# Patient Record
Sex: Male | Born: 1950 | Race: White | Hispanic: No | Marital: Married | Smoking: Never smoker
Health system: Southern US, Community
[De-identification: ages and names within clinical notes are randomized; demographics above are authoritative.]

## PROBLEM LIST (undated history)

## (undated) DIAGNOSIS — C801 Malignant (primary) neoplasm, unspecified: Secondary | ICD-10-CM

## (undated) DIAGNOSIS — M1990 Unspecified osteoarthritis, unspecified site: Secondary | ICD-10-CM

---

## 2000-06-15 ENCOUNTER — Encounter: Payer: Self-pay | Admitting: Orthopedic Surgery

## 2000-06-15 ENCOUNTER — Ambulatory Visit (HOSPITAL_COMMUNITY): Admission: RE | Admit: 2000-06-15 | Discharge: 2000-06-15 | Payer: Self-pay | Admitting: Orthopedic Surgery

## 2000-10-27 ENCOUNTER — Encounter: Payer: Self-pay | Admitting: Neurosurgery

## 2000-10-27 ENCOUNTER — Encounter: Admission: RE | Admit: 2000-10-27 | Discharge: 2000-10-27 | Payer: Self-pay | Admitting: Neurosurgery

## 2000-11-10 ENCOUNTER — Encounter: Admission: RE | Admit: 2000-11-10 | Discharge: 2000-11-10 | Payer: Self-pay | Admitting: Neurosurgery

## 2000-11-10 ENCOUNTER — Encounter: Payer: Self-pay | Admitting: Neurosurgery

## 2005-08-10 ENCOUNTER — Emergency Department (HOSPITAL_COMMUNITY): Admission: EM | Admit: 2005-08-10 | Discharge: 2005-08-10 | Payer: Self-pay | Admitting: Emergency Medicine

## 2006-10-20 ENCOUNTER — Encounter: Admission: RE | Admit: 2006-10-20 | Discharge: 2006-10-20 | Payer: Self-pay | Admitting: Orthopedic Surgery

## 2006-12-21 ENCOUNTER — Inpatient Hospital Stay (HOSPITAL_COMMUNITY): Admission: RE | Admit: 2006-12-21 | Discharge: 2006-12-24 | Payer: Self-pay | Admitting: Orthopedic Surgery

## 2007-04-03 ENCOUNTER — Inpatient Hospital Stay (HOSPITAL_COMMUNITY): Admission: RE | Admit: 2007-04-03 | Discharge: 2007-04-06 | Payer: Self-pay | Admitting: Orthopedic Surgery

## 2007-09-27 ENCOUNTER — Ambulatory Visit: Payer: Self-pay | Admitting: Gastroenterology

## 2007-10-11 ENCOUNTER — Ambulatory Visit: Payer: Self-pay | Admitting: Gastroenterology

## 2010-03-26 HISTORY — PX: HIP SURGERY: SHX245

## 2010-09-24 HISTORY — PX: MELANOMA EXCISION: SHX5266

## 2010-12-08 NOTE — Op Note (Signed)
Samuel Lee, Samuel Lee            ACCOUNT NO.:  [id number]   MEDICAL RECORD NO.:  [id number]          PATIENT TYPE:  INP   LOCATION:  1613                         FACILITY:  [facility]   PHYSICIAN:  Ollen Gross, M.D.    DATE OF BIRTH:  [birth year]   DATE OF PROCEDURE:  04/03/2007  DATE OF DISCHARGE:                               OPERATIVE REPORT   PREOPERATIVE DIAGNOSIS:  Osteoarthritis left hip.   POSTOPERATIVE DIAGNOSIS:  Osteoarthritis left hip.   PROCEDURE:  Left total hip arthroplasty.   SURGEON:  Ollen Gross, M.D.   ASSISTANT:  Alexzandrew L. Perkins, P.A.-C.   ANESTHESIA:  General.   ESTIMATED BLOOD LOSS:  600.   DRAIN:  None.   COMPLICATIONS:  None.   CONDITION:  Stable to recovery.   BRIEF CLINICAL NOTE:  Samuel Lee is a 60 year old male with end-stage  arthritis of left hip, with progressively worsening pain and  dysfunction.  He has had a successful right total hip arthroplasty and  presents now for left total hip arthroplasty.   PROCEDURE IN DETAIL:  After successful initiation of general anesthetic,  the patient was placed in the right lateral decubitus position with the  left side up and held with the hip positioner.  The left lower extremity  was isolated from his perineum with plastic drapes and prepped and  draped in the usual sterile fashion.  A short posterolateral incision  was made with a 10 blade through subcutaneous tissue to the level of the  fascia lata which was incised in line with the skin incision.  Sciatic  nerve was palpated and protected and the short rotators were isolated  off the femur.  Capsulectomy was performed, and the hip was dislocated.  Center of the femoral head is marked and a trial prosthesis placed such  that the center of the trial head corresponds to the center of his  native femoral head.  Osteotomy line was marked on the femoral neck and  osteotomy made with an oscillating saw.  Femoral head was removed and  the femur  distracted anteriorly to gain acetabular exposure.   Acetabular retractors were placed and labrum and osteophytes removed.  Reaming starts at 45 mm, coursing in increments of 2 to 53 mm, and then  a 54 mm Pinnacle acetabular shell was placed in anatomic position and  transfixed with two dome screws.  The apex hole eliminator was placed.  Then, a 36 mm neutral Ultamet metal liner was placed.   Femur was prepared with the canal finder and irrigation.  Axial reaming  was performed to 11.5 mm, proximal reaming to a 40F, and the sleeve  machine to a large.  A 40F large trial sleeve was placed with a 16 x 11  stem and a 36 standard neck.  We matching the native anteversion with  the stem.  A 36 head is placed, and hip reduces easily.  It appears to  need more offset.  We trialed with a 36 +6, and it reduced with much  more suitable tension.  We were able to achieve full extension, full  external rotation, 70 degrees flexion,  40 degrees adduction, 90 degrees  internal rotation, 90 degrees flexion, and 70 degrees of internal  rotation.  By placing the left leg on top of the right, it felt as  though leg lengths were equal.  Hip was then dislocated, and trials were  removed.  The permanent 81F large sleeve was placed, with 16 x 11 stem,  36 +6 neck, matching native anteversion.  The 36 +0 head was placed.  The hips was reduced, with the same stability parameters.  Wound was  copiously irrigated with saline solution, and short rotators were  reattached to the femur through drill holes.  Fascia lata was closed  with interrupted #1 Vicryl, subcutaneous closed with #1, then and 2-0  Vicryl, and subcuticular running 4-0 Monocryl.  Incision was cleaned and  dried, and Steri-Strips and a bulky sterile dressing applied.  He was  then placed into a knee immobilizer, awakened, and transported to  recovery in stable condition.      Ollen Gross, M.D.  Electronically Signed     FA/MEDQ  D:   04/03/2007  T:  04/04/2007  Job:  [id number]

## 2010-12-08 NOTE — H&P (Signed)
Samuel Lee, Samuel Lee            ACCOUNT NO.:  [id number]   MEDICAL RECORD NO.:  [id number]          PATIENT TYPE:  INP   LOCATION:  NA                           FACILITY:  [facility]   PHYSICIAN:  Ollen Gross, M.D.    DATE OF BIRTH:  1951/05/16   DATE OF ADMISSION:  04/03/2007  DATE OF DISCHARGE:                              HISTORY & PHYSICAL   DATE OF OFFICE VISIT AND HISTORY OF PHYSICAL:  March 28, 2007.   CHIEF COMPLAINT:  Left hip pain.   HISTORY OF PRESENT ILLNESS:  The patient is a 60 year old male who has  been seen by Dr. Lequita Halt for ongoing left hip pain.  He was known to  have bilateral hip arthritis, but he has undergone a previous right  total hip and doing well with this.  He has recovered well and now  presents for the left hip.   ALLERGIES:  NO KNOWN DRUG ALLERGIES.   CURRENT MEDICATIONS:  OXYCONTIN.   PAST MEDICAL HISTORY:  1. Renal calculi.  2. Lumbar degenerative disc disease.   PAST SURGICAL HISTORY:  1. Eye surgery.  2. Right total hip.   SOCIAL HISTORY:  Married.  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  Nonsmoker.  No  alcohol.  Two children.  Wife will be assisting with care after surgery.   FAMILY HISTORY:  Father living, age 52, with heart disease and CABG.  Mother deceased at age 66.  Brother with an arrhythmia, deceased at age  44.   REVIEW OF SYSTEMS:  GENERAL:  No fevers, chills, or night sweats.  NEUROLOGIC:  No seizures, syncope, or paralysis.  RESPIRATORY:  No  shortness of breath, productive cough, or hemoptysis.  CARDIOVASCULAR:  No chest pain, angina, or orthopnea.  GI:  No nausea, vomiting,  diarrhea, or constipation.  GU:  No dysuria, hematuria, or discharge.  MUSCULOSKELETAL:  Left hip.   PHYSICAL EXAMINATION:  VITAL SIGNS:  Pulse 64, respirations 12, blood  pressure 112/64.  GENERAL:  A 60 year old white male, well nourished, well developed, in  no acute distress.  He is alert, oriented, and cooperative, pleasant.  Good historian,  accompanied by his wife.  HEENT:  Normocephalic, atraumatic.  Pupils round, reactive.  Oropharynx  clear.  EOMs intact.  NECK:  Supple.  CHEST:  Clear anterior and posterior chest walls.  No rhonchi, rales, or  wheezing.  HEART:  Regular rate and rhythm.  No murmur.  S1, S2 noted.  ABDOMEN:  Soft, nontender.  Bowel sounds present.  RECTAL/BREASTS/GENITALIA:  Not done.  Not pertinent to present illness.  EXTREMITIES:  Left hip:  Motor function is intact.  Painful range of  motion.  Slight antalgic gait.   IMPRESSION:  Osteoarthritis left hip.   PLAN:  The patient will be admitted to [facility] to undergo a  left total hip replacement arthroplasty.  Surgery will be performed by  Dr. Ollen Gross.      Alexzandrew L. Perkins, P.A.C.      Ollen Gross, M.D.  Electronically Signed    ALP/MEDQ  D:  04/02/2007  T:  04/03/2007  Job:  [id number]  cc:   Joycelyn Rua, M.D.  Fax: [phone]

## 2010-12-08 NOTE — Discharge Summary (Signed)
NAME:  Samuel Lee, Samuel Lee            ACCOUNT NO.:  [id number]   MEDICAL RECORD NO.:  [id number]          PATIENT TYPE:  INP   LOCATION:  1613                         FACILITY:  [facility]   PHYSICIAN:  Alexzandrew L. Perkins, P.A.C.DATE OF BIRTH:  1950/10/23   DATE OF ADMISSION:  04/03/2007  DATE OF DISCHARGE:  04/06/2007                               DISCHARGE SUMMARY   ADMISSION DIAGNOSES:  1. Osteoarthritis left hip.  2. Lumbar degenerative disk disease.  3. History of renal calculi.   DISCHARGE DIAGNOSES:  Osteoarthritis left hip status post total hip  replacement arthroplasty.  __________ .   PROCEDURE:  Left total hip surgery with Dr. Despina Hick, assistant Julien Girt,  PA-C, anesthesia general.   CONSULTS:  None.   BRIEF HISTORY:  The patient is a 60 year old male with end-stage  arthritis of the left hip. __________   LABORATORY DATA:  __________  hemoglobin 13, hematocrit __________ ,  last H&H is 10.9 and 30.8.  __________  INR 1.1.  __________ .  X-rays left hip March 28, 2007 osteoarthritic changes left hip.   __________ .  Had a pretty rough night with pain __________  a little  bit better.  The next morning he was placed back on his OxyContin, which  he uses at home.  Re-encouraged p.o., pain medications PCA for backup.  Started getting him out of bed.  Re urinary output.  Hemoglobin stable.  By day 2, the pain was under a little bit better control so we  discontinued the PCA and, from a therapy standpoint, started improving.  Started getting up and walking about 175 feet, later 300 feet that  afternoon.  Dressing was changed on day 2 and the incision looks good.  No signs of infection by day 3.  He was doing well.  Tolerating  regimens, ready for discharge home.   DISCHARGE PLAN:  The patient is discharged home on 04/06/07.   DISCHARGE DIAGNOSIS:  Please see above.   DISCHARGE MEDICATIONS:  1. OxyContin.  2. Percocet.  3. Robaxin.  4. Coumadin.   DIET:  Resume  home diet.   ACTIVITY:  Partial weightbearing 25-50% left lower extremity.  __________  total hip protocol.   FOLLOWUP:  In 2 weeks.   DISPOSITION:  __________  condition on discharge improved.      Alexzandrew L. Perkins, P.A.C.     ALP/MEDQ  D:  04/06/2007  T:  04/06/2007  Job:  [id number]   cc:   Ollen Gross, M.D.  Fax: [phone]   Joycelyn Rua, M.D.  Fax: [phone]

## 2010-12-08 NOTE — Op Note (Signed)
NAMESASAN, WILKIE            ACCOUNT NO.:  [id number]   MEDICAL RECORD NO.:  [id number]          PATIENT TYPE:  INP   LOCATION:  X003                         FACILITY:  [facility]   PHYSICIAN:  Ollen Gross, M.D.    DATE OF BIRTH:  10/15/1950   DATE OF PROCEDURE:  12/21/2006  DATE OF DISCHARGE:                               OPERATIVE REPORT   PREOPERATIVE DIAGNOSIS:  Osteoarthritis, right hip.   POSTOPERATIVE DIAGNOSIS:  Osteoarthritis, right hip.   PROCEDURE:  Right total hip arthroplasty.   SURGEON:  Ollen Gross, M.D.   ASSISTANT:  Avel Peace PA-C   ANESTHESIA:  General.   ESTIMATED BLOOD LOSS:  400.   DRAINS:  None.   COMPLICATIONS:  None.   CONDITION:  Stable to recovery.   CLINICAL NOTE:  Samuel Lee a 60 year old male who has end-stage arthritis  of both hips, right more symptomatic than the left.  He has had  significant pain and dysfunction and presents for total hip  arthroplasty.   PROCEDURE IN DETAIL:  After successful administration of general  anesthetic, the patient's placed in left lateral decubitus position, the  right side up and held with the hip positioner. The right lower  extremity was isolated from his perineum with plastic drapes and prepped  and draped in usual sterile fashion.  Short posterolateral incision was  made with a 10 blade through the subcutaneous tissue to the level of the  fascia lata which was incised in line with the skin incision.  Sciatic  nerve was palpated and protected and the short rotators were isolated  off the femur.  Capsulectomy is performed and the hip was dislocated.  The center of femoral head is marked and a trial prosthesis placed such  that the center of the trial head corresponds to center of his native  femoral head.  Osteotomy lines marked on the femoral neck and osteotomy  made with an oscillating saw.  Femoral head is removed and the femur  retracted anteriorly to get acetabular exposure.   Acetabular  retractors were placed and labrum and osteophytes removed.  Reaming starts at 45 mm coursing increments of 2-51 mm and a 52 mm  pinnacle acetabular shell was placed in anatomic position and transfixed  with two dome screws.  The apex hole eliminator is placed and then the  36 mm neutral Ultramet metal liner was placed for a metal-on-metal hip  replacement.   The femur is prepared with the canal finder and irrigation.  Axial  reaming is performed 13.5 mm, proximal reaming to 24F and the sleeve  machine to a large.  A 24F large trial sleeve is placed with 18 x 15  stem and a 36+8 neck matching native anteversion.  The 36+0 trial was  placed.  The hip had too much offset and was not easily reduced.  I went  to a 36 standard neck matching native anteversion with a 36+0 head.  This led to excellent stable reduction and with appropriate offset.  There was full extension, full external rotation, 70 degrees flexion, 40  degrees adduction, 90 degrees internal rotation and 90 degrees of  flexion, 70 degrees of internal rotation.  By placing the right leg on  top of the left it felt as though the leg lengths were equal.  The hip  was then dislocated and trials removed.  The permanent 30F large sleeve  is placed with the 18 x 13 stem, 36 standard neck matching native  anteversion, 36+0 head is placed and the hips reduced with the same  stability parameters.  Wounds copiously irrigated with saline solution  and short rotators reattached to the femur through drill holes.  The  fascia lata was closed over a Hemovac drain with interrupted #1 Vicryl.  Subcu closed with #1 and then 2-0 Vicryl and subcuticular running 4-0  Monocryl.  Incisions cleaned and dried and Steri-Strips and bulky  sterile dressing applied.  Placed into a knee immobilizer, awakened,  transported to recovery in stable condition.      Ollen Gross, M.D.  Electronically Signed     FA/MEDQ  D:  12/21/2006  T:  12/21/2006  Job:   [id number]

## 2010-12-11 NOTE — H&P (Signed)
NAMESHALAMAR, CRAYS            ACCOUNT NO.:  [id number]   MEDICAL RECORD NO.:  [id number]          PATIENT TYPE:  INP   LOCATION:  NA                           FACILITY:  [facility]   PHYSICIAN:  Ollen Gross, M.D.    DATE OF BIRTH:  [birth year]   DATE OF ADMISSION:  12/21/2006  DATE OF DISCHARGE:                              HISTORY & PHYSICAL   CHIEF COMPLAINT:  Right hip pain.   HISTORY OF PRESENT ILLNESS:  The patient is a 60 year old male who has  been seen in second opinion by Dr. Lequita Halt for ongoing hip pain.  It has  been ongoing for quite some time now, for many years, it has been  progressive in nature.  He has been seen by two orthopedic surgeons in  the past and told that he had arthritic hips.  It has progressively  gotten worse.  He was seen in the office by Dr. Lequita Halt and found to  have severe arthritis in both hips, the right is worse on radiograph as  compared to the left.  He continues to have pain and dysfunction despite  conservative measures.  It was felt he would benefit from undergoing  surgical intervention.  The risks and benefits have been discussed and  he elects to proceed with surgery.   ALLERGIES:  NO KNOWN DRUG ALLERGIES.   CURRENT MEDICATIONS:  OxyContin.   PAST MEDICAL HISTORY:  Renal calculi.   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  Nonsmoker.  No alcohol.  Two children.  Wife will be assisting him with  care after surgery.   FAMILY HISTORY:  Mother with history of arthritis.  Brother deceased  secondary to an arrhythmia at age 17.   REVIEW OF SYSTEMS:  GENERAL:  General view shows no fevers, chills or  night sweats, seizures, syncope or paralysis.  RESPIRATORY:  No  shortness of productive, no cough or hemoptysis.  CARDIOVASCULAR:  No  chest pain or orthopnea.  GI:  No nausea, vomiting, diarrhea or  constipation.  GU: No dysuria, hematuria or  discharge.  MUSCULOSKELETAL:  Right hip.   PHYSICAL EXAMINATION:  VITAL SIGNS:  Pulse 72, respirations 12, blood  pressure 112/78.  GENERAL:  A 56-year white male well-nourished, well-developed, small  framed, alert and cooperative, accompanied by his wife.  HEENT:  Normocephalic, atraumatic.  Pupils are round and reactive.  EOMs  intact.  He does have decreased vision slightly in the left eye.  He  does wear glasses when he is working and reading.  NECK:  Supple.  CHEST:  Clear.  HEART: Regular rate and rhythm.  No murmur, S1 and S2.  ABDOMEN:  Soft and nontender.  Bowel sounds are present.  RECTAL:  Not done.  EXTREMITIES:  Right hip shows flexion to 90 degrees, 15 degrees of  abduction, 0 of internal rotation, 0 external rotation.   IMPRESSION:  Osteoarthritis, right hip.   PLAN:  The patient is admitted to [facility] to undergo a  right total replacement  arthroplasty.  Surgery will be performed by Dr.  Ollen Gross.      Alexzandrew L. Perkins, P.A.C.      Ollen Gross, M.D.  Electronically Signed    ALP/MEDQ  D:  12/20/2006  T:  12/21/2006  Job:  [id number]   cc:   Ollen Gross, M.D.  Fax: [phone]   Joycelyn Rua, M.D.  Fax: [phone]

## 2010-12-11 NOTE — Discharge Summary (Signed)
Samuel Lee, Samuel Lee            ACCOUNT NO.:  [id number]   MEDICAL RECORD NO.:  [id number]          PATIENT TYPE:  INP   LOCATION:  1613                         FACILITY:  [facility]   PHYSICIAN:  Ollen Gross, M.D.    DATE OF BIRTH:  1951/03/23   DATE OF ADMISSION:  04/03/2007  DATE OF DISCHARGE:  04/06/2007                               DISCHARGE SUMMARY   Re-dictating a discharge summary, apparently there was a lot left out.   ADMISSION DIAGNOSES:  1. Osteoarthritis, left hip.  2. Lumbar degenerative disease.  3. History of renal calculi.   DISCHARGE DIAGNOSES:  1. Osteoarthritis, left hip, status post left total hip arthroplasty.  2. Lumbar degenerative disease.  3. History of renal calculi.   PROCEDURE:  On April 04, 2007, left total hip.   SURGEON:  Ollen Gross, M.D.   ASSISTANT:  Alexzandrew L. Perkins, P.A.C.   ANESTHESIA:  General.   CONSULTATIONS:  None.   BRIEF HISTORY:  The patient is a 60 year old male with end-stage  arthritis of the left hip with progressive worsening pain, dysfunction,  successful right total hip and now presents for left total hip.   LABORATORY DATA:  Preop CBC showed a hemoglobin of 15.0, hematocrit  43.2, white cell count 6.2.  Postop hemoglobin 12.2 drifting down to  10.7, came back up last night, H&H 10.9 and 30.8.  PT/PTT preop 14.2 and  28 respectively.  INR 1.1.  Serial pro-times followed.  Last noted  PT/INR 24.7 and 2.2.  Chem panel on admission all within normal limits.  Serial BMETs were followed.  Electrolytes remained within normal limits.  Preop UA negative.  Blood group type O+.   DIAGNOSTICS:  1. Hip films preop on April 04, 2007:  Osteoarthritic changes left      hip.  2. Portable pelvis and hip:  Satisfactory position left total hip.  3. EKG Dec 02, 2006:  Sinus rhythm rate 71, normal EKG confirmed by Dr.      Mayford Knife COURSE:  The patient was admitted to [facility] and  tolerated the  procedure well.  Later transferred to the recovery room  and then to the orthopedic floor, started on PCA and p.o. analgesics for  pain control following surgery.  He had a pretty tough night following  surgery.  He was doing a little bit better on the morning of day 1.  He  was on OxyContin continued dose medications postoperatively.  Percocet  for pain.  Weaned over to p.o. medications.  He had excellent urinary  output.  Started getting out of bed on day 1.  By day 2, pain was still  present, but doing a little bit better under better control.  He started  getting up and ambulating fairly well, actually did about 175 feet and  then 300 feet on day 2.  Dressing was changed.  Incision looked good.  Pain was under better control, so we discontinued the PCA and just  weaned over to p.o. meds.  He continued to progress well.  By day 3, he  was doing well and ready to  go home.   DISPOSITION:  The patient discharged home on April 06, 2007.   ASSESSMENT:  1. Osteoarthritis, left hip, status post left total hip arthroplasty.  2. Lumbar degenerative disease.  3. History of renal calculi.   DISCHARGE MEDICATIONS:  1. OxyContin.  2. Percocet.  3. Robaxin.  4. Coumadin.   DIET:  Resume home diet.   ACTIVITIES:  Partial weightbearing 25-50% to the left lower extremity.  He should be on total hip protocol hip precautions.   DISCHARGE INSTRUCTIONS:  1. Home health, PT home health nursing.  2. Follow up in 2 weeks.   CONDITION ON DISCHARGE:  Improved.      Alexzandrew L. Perkins, P.A.C.      Ollen Gross, M.D.  Electronically Signed    ALP/MEDQ  D:  05/11/2007  T:  05/12/2007  Job:  [id number]   cc:   Ollen Gross, M.D.  Fax: [phone]   Joycelyn Rua, M.D.  Fax: [phone]

## 2010-12-11 NOTE — Discharge Summary (Signed)
NAMEMARKON, JARES            ACCOUNT NO.:  [id number]   MEDICAL RECORD NO.:  [id number]          PATIENT TYPE:  INP   LOCATION:  1616                         [facility]ACILITY[facility]  [facility]   PHYSICIAN:  Ollen Gross, M.D.    DATE OF BIRTH:  1951-04-27   DATE OF ADMISSION:  12/21/2006  DATE OF DISCHARGE:  12/24/2006                               DISCHARGE SUMMARY   ADMITTING DIAGNOSES:  1. Osteoarthritis, right hip.  2. History of renal calculi.   DISCHARGE DIAGNOSES:  1. Osteoarthritis right hip status post right total hip arthroplasty.  2. Postop blood loss anemia.  Did not require transfusion.  3. History of renal calculi.   PROCEDURE:  On Dec 21, 2006, right total hip.  Surgeon Dr. Lequita Halt,  assistant Avel Peace PA-C.  Anesthesia general.   CONSULTATIONS:  None.   BRIEF HISTORY:  Samuel Lee is a 60 year old male with end-stage arthritis  of both hips, right more symptomatic than left.  He had significant pain  dysfunction who presents now for total hip.   LABORATORY DATA:  Preop CBC showed hemoglobin 15.0, hematocrit of 43.5,  white cell count 7.7.  Postop hemoglobin 0.8, drift down to 11, last H&H  10.3 and 29.4.  PT/PTT preop 14.7 and 28, respectively.  INR 1.1.  Serial pro times followed.  PT/INR 34.2 and 3.1.  Chem panel on  admission all within normal limits.  Serial B-mets were followed.  Sodium did drop down to 137, last noted at 134.  Preop UA negative.  Blood group type O+.  Chest x-ray Dec 15, 2006, no active disease.  Hip  films Dec 15, 2006, bilateral hip arthritis, right greater than left.  Possible free osteochondral body medial to the right femoral neck.  Postop films:  No apparent complication post hip arthroplasty.   HOSPITAL COURSE:  The patient was Hospital tolerated see well, later  transferred to recovery room on the orthopedic floor.  Started on PCA  and p.o. analgesic for pain control following surgery.  Given 24 hours  postop IV antibiotics.  Placed on  Coumadin for DVT prophylaxis.  Doing  pretty well, was able to get some rest the night after surgery.  Was a  little groggy the next morning.  Had excellent urinary output.  Hemoglobin stable at 11.8.  Started partial weightbearing.  Got up with  PT.  Doing a little bit better by the next day.  Hemoglobin was stable  at 11.  Dressing changed.  Incision looked good.  He was already up  ambulating in the hallway.  He is up walking about 200 feet, doing  extremely well.  Progressed well and was ready to go home by the  following day of Dec 24, 2006.   DISCHARGE/PLAN:  1. Discharged home Dec 24, 2006.  2. Discharge diagnoses, please see above.  3. Discharge medication:  OxyContin, Percocet, Robaxin, Coumadin.  4. Diet.  Resume home diet.  5. Activity:  Partial weightbearing right lower extremity 25-50%.  6. Hip precautions.  Total hip protocol with PT.  7. Home Health nursing.  8. Follow-up 2 weeks.   DISPOSITION:  Home.  CONDITION ON DISCHARGE:  Improved.      Alexzandrew L. Perkins, P.A.C.      Ollen Gross, M.D.  Electronically Signed    ALP/MEDQ  D:  01/20/2007  T:  01/20/2007  Job:  [id number]   cc:   Ollen Gross, M.D.  Fax: [phone]   Joycelyn Rua, M.D.  Fax: [phone]

## 2011-05-07 LAB — CBC
HCT: 30.8 — ABNORMAL LOW
Hemoglobin: 10.7 — ABNORMAL LOW
MCHC: 34.9
Platelets: 158
Platelets: 187
Platelets: 211
RBC: 3.61 — ABNORMAL LOW
RBC: 3.66 — ABNORMAL LOW
RBC: 4.12 — ABNORMAL LOW
RDW: 13.3
RDW: 13.5
WBC: 7.2
WBC: 8
WBC: 8

## 2011-05-07 LAB — CROSSMATCH
ABO/RH(D): O POS
Antibody Screen: NEGATIVE

## 2011-05-07 LAB — BASIC METABOLIC PANEL
BUN: 6
Calcium: 8 — ABNORMAL LOW
Chloride: 103
Creatinine, Ser: 0.73
GFR calc non Af Amer: >60
GFR calc non Af Amer: >60
Potassium: 4.2
Sodium: 135
Sodium: 138

## 2011-05-07 LAB — URINALYSIS, ROUTINE W REFLEX MICROSCOPIC
Bilirubin Urine: NEGATIVE
Ketones, ur: NEGATIVE
Nitrite: NEGATIVE
Protein, ur: NEGATIVE
Urobilinogen, UA: 1

## 2011-05-07 LAB — COMPREHENSIVE METABOLIC PANEL
AST: 19
Albumin: 3.9
Alkaline Phosphatase: 50
BUN: 18
Chloride: 106
Potassium: 4.2
Total Bilirubin: 0.9
Total Protein: 6.5

## 2011-05-07 LAB — PROTIME-INR
INR: 1.1
INR: 1.4
INR: 2.7 — ABNORMAL HIGH
Prothrombin Time: 14.2
Prothrombin Time: 17.2 — ABNORMAL HIGH

## 2013-08-31 ENCOUNTER — Encounter (INDEPENDENT_AMBULATORY_CARE_PROVIDER_SITE_OTHER): Payer: Self-pay

## 2013-08-31 ENCOUNTER — Ambulatory Visit (INDEPENDENT_AMBULATORY_CARE_PROVIDER_SITE_OTHER): Payer: PRIVATE HEALTH INSURANCE | Admitting: Surgery

## 2013-08-31 ENCOUNTER — Encounter (INDEPENDENT_AMBULATORY_CARE_PROVIDER_SITE_OTHER): Payer: Self-pay | Admitting: Surgery

## 2013-08-31 VITALS — BP 132/78 | HR 86 | Temp 98.0°F | Resp 18 | Ht 69.0 in | Wt 197.0 lb

## 2013-08-31 DIAGNOSIS — K4090 Unilateral inguinal hernia, without obstruction or gangrene, not specified as recurrent: Secondary | ICD-10-CM

## 2013-08-31 NOTE — Patient Instructions (Signed)

## 2013-08-31 NOTE — Progress Notes (Signed)
Patient ID: Samuel Lee, male   DOB: 1951/05/06, 63 y.o.   MRN: [id number]  Chief Complaint  Patient presents with  . New Evaluation    R/H    HPI Samuel Lee is a 63 y.o. male.  Patient presents from urgent care do to bulge right groin that is painful. Patient developed right groin pain 2 weeks ago after a weightlifting workout. He denies any swelling in his right groin. He does have intermittent right groin pain made worse with exertion and made better with rest. The pain can be severe at times sharp in nature without radiation location right groin. He is concerned he has a hernia. HPI  No past medical history on file.  Past Surgical History  Procedure Laterality Date  . Hip surgery  03/2010    bilaterally hip  . Melanoma excision  09/2010    nose    No family history on file.  Social History History  Substance Use Topics  . Smoking status: Never Smoker   . Smokeless tobacco: Not on file  . Alcohol Use: Not on file    No Known Allergies  Current Outpatient Prescriptions  Medication Sig Dispense Refill  . naproxen sodium (ANAPROX) 220 MG tablet Take 220 mg by mouth 2 (two) times daily with a meal.       No current facility-administered medications for this visit.    Review of Systems Review of Systems  Constitutional: Negative for fever, chills and unexpected weight change.  HENT: Negative for congestion, hearing loss, sore throat, trouble swallowing and voice change.   Eyes: Negative for visual disturbance.  Respiratory: Negative for cough and wheezing.   Cardiovascular: Negative for chest pain, palpitations and leg swelling.  Gastrointestinal: Negative for nausea, vomiting, abdominal pain, diarrhea, constipation, blood in stool, abdominal distention, anal bleeding and rectal pain.  Genitourinary: Negative for hematuria and difficulty urinating.  Musculoskeletal: Negative for arthralgias.  Skin: Negative for rash and wound.  Neurological: Negative for  seizures, syncope, weakness and headaches.  Hematological: Negative for adenopathy. Does not bruise/bleed easily.  Psychiatric/Behavioral: Negative for confusion.    Blood pressure 132/78, pulse 86, temperature 98 F (36.7 C), resp. rate 18, height 5\' 9"  (1.753 m), weight 197 lb (89.359 kg).  Physical Exam Physical Exam  Constitutional: He is oriented to person, place, and time. He appears well-developed and well-nourished.  HENT:  Head: Normocephalic and atraumatic.  Eyes: EOM are normal. Pupils are equal, round, and reactive to light.  Neck: Normal range of motion. Neck supple.  Cardiovascular: Normal rate and regular rhythm.   Pulmonary/Chest: Effort normal and breath sounds normal.  Abdominal: Soft. There is no tenderness. A hernia is present. Hernia confirmed positive in the right inguinal area.  Musculoskeletal: Normal range of motion.  Neurological: He is alert and oriented to person, place, and time.  Skin: Skin is warm and dry.  Psychiatric: He has a normal mood and affect. His behavior is normal. Judgment normal.    Data Reviewed   Assessment    Right inguinal hernia symptomatically    Plan    Recommend repair right angle hernia with mesh.The risk of hernia repair include bleeding,  Infection,   Recurrence of the hernia,  Mesh use, chronic pain,  Organ injury,  Bowel injury,  Bladder injury,   nerve injury with numbness around the incision,  Death,  and worsening of preexisting  medical problems.  The alternatives to surgery have been discussed as well..  Long term expectations of both operative and  non operative treatments have been discussed.   The patient agrees to proceed.       Samuel Lee A. 08/31/2013, 12:48 PM

## 2013-10-15 ENCOUNTER — Encounter (INDEPENDENT_AMBULATORY_CARE_PROVIDER_SITE_OTHER): Payer: PRIVATE HEALTH INSURANCE | Admitting: Surgery

## 2013-12-06 ENCOUNTER — Ambulatory Visit: Admit: 2013-12-06 | Payer: Self-pay | Admitting: Surgery

## 2013-12-06 SURGERY — REPAIR, HERNIA, INGUINAL, ADULT
Anesthesia: General | Laterality: Right

## 2014-11-25 ENCOUNTER — Ambulatory Visit: Payer: Self-pay | Admitting: Surgery

## 2015-01-28 ENCOUNTER — Encounter: Payer: Self-pay | Admitting: Gastroenterology

## 2015-04-04 ENCOUNTER — Encounter (HOSPITAL_BASED_OUTPATIENT_CLINIC_OR_DEPARTMENT_OTHER): Payer: Self-pay | Admitting: *Deleted

## 2015-04-09 ENCOUNTER — Ambulatory Visit (HOSPITAL_BASED_OUTPATIENT_CLINIC_OR_DEPARTMENT_OTHER)
Admission: RE | Admit: 2015-04-09 | Discharge: 2015-04-09 | Disposition: A | Discharge status: Home / Self Care | Payer: PRIVATE HEALTH INSURANCE | Source: Ambulatory Visit | Attending: Surgery | Admitting: Surgery

## 2015-04-09 ENCOUNTER — Encounter (HOSPITAL_BASED_OUTPATIENT_CLINIC_OR_DEPARTMENT_OTHER): Payer: Self-pay | Admitting: Anesthesiology

## 2015-04-09 ENCOUNTER — Ambulatory Visit (HOSPITAL_BASED_OUTPATIENT_CLINIC_OR_DEPARTMENT_OTHER): Payer: PRIVATE HEALTH INSURANCE | Admitting: Anesthesiology

## 2015-04-09 ENCOUNTER — Encounter (HOSPITAL_BASED_OUTPATIENT_CLINIC_OR_DEPARTMENT_OTHER)
Admission: RE | Disposition: A | Discharge status: Home / Self Care | Payer: Self-pay | Source: Ambulatory Visit | Attending: Surgery

## 2015-04-09 DIAGNOSIS — K4090 Unilateral inguinal hernia, without obstruction or gangrene, not specified as recurrent: Secondary | ICD-10-CM | POA: Diagnosis present

## 2015-04-09 DIAGNOSIS — M1990 Unspecified osteoarthritis, unspecified site: Secondary | ICD-10-CM | POA: Insufficient documentation

## 2015-04-09 HISTORY — PX: INGUINAL HERNIA REPAIR: SHX194

## 2015-04-09 HISTORY — DX: Malignant (primary) neoplasm, unspecified: C80.1

## 2015-04-09 HISTORY — PX: INSERTION OF MESH: SHX5868

## 2015-04-09 HISTORY — DX: Unspecified osteoarthritis, unspecified site: M19.90

## 2015-04-09 SURGERY — REPAIR, HERNIA, INGUINAL, ADULT
Anesthesia: Regional | Site: Abdomen | Laterality: Right

## 2015-04-09 MED ORDER — LIDOCAINE HCL (CARDIAC) 20 MG/ML IV SOLN
INTRAVENOUS | Status: DC | PRN
  Administered 2015-04-09: 50 mg via INTRAVENOUS

## 2015-04-09 MED ORDER — GLYCOPYRROLATE 0.2 MG/ML IJ SOLN: 0.2000 mg | Freq: Once | INTRAMUSCULAR | Status: DC | PRN

## 2015-04-09 MED ORDER — ONDANSETRON HCL 4 MG/2ML IJ SOLN
INTRAMUSCULAR | Status: DC | PRN
  Administered 2015-04-09: 4 mg via INTRAVENOUS

## 2015-04-09 MED ORDER — FENTANYL CITRATE (PF) 100 MCG/2ML IJ SOLN
INTRAMUSCULAR | Status: AC
  Filled 2015-04-09: qty 2

## 2015-04-09 MED ORDER — CEFAZOLIN SODIUM-DEXTROSE 2-3 GM-% IV SOLR
INTRAVENOUS | Status: AC
Start: 2015-04-09 — End: 2015-04-09
  Filled 2015-04-09: qty 50

## 2015-04-09 MED ORDER — OXYCODONE HCL 5 MG PO TABS
ORAL_TABLET | ORAL | Status: AC
  Filled 2015-04-09: qty 1

## 2015-04-09 MED ORDER — HYDROMORPHONE HCL 1 MG/ML IJ SOLN
0.2500 mg | INTRAMUSCULAR | Status: DC | PRN
  Administered 2015-04-09 (×2): 0.5 mg via INTRAVENOUS

## 2015-04-09 MED ORDER — OXYCODONE-ACETAMINOPHEN 5-325 MG PO TABS: 1.0000 | ORAL_TABLET | ORAL | Status: DC | PRN

## 2015-04-09 MED ORDER — OXYCODONE HCL 5 MG/5ML PO SOLN: 5.0000 mg | Freq: Once | ORAL | Status: AC | PRN

## 2015-04-09 MED ORDER — OXYCODONE HCL 5 MG PO TABS
5.0000 mg | ORAL_TABLET | Freq: Once | ORAL | Status: AC | PRN
  Administered 2015-04-09: 5 mg via ORAL

## 2015-04-09 MED ORDER — SCOPOLAMINE 1 MG/3DAYS TD PT72: 1.0000 | MEDICATED_PATCH | Freq: Once | TRANSDERMAL | Status: DC | PRN

## 2015-04-09 MED ORDER — LACTATED RINGERS IV SOLN
INTRAVENOUS | Status: DC
  Administered 2015-04-09 (×2): via INTRAVENOUS

## 2015-04-09 MED ORDER — BUPIVACAINE-EPINEPHRINE 0.25% -1:200000 IJ SOLN
INTRAMUSCULAR | Status: DC | PRN
  Administered 2015-04-09: 10 mL

## 2015-04-09 MED ORDER — MIDAZOLAM HCL 2 MG/2ML IJ SOLN
INTRAMUSCULAR | Status: AC
  Filled 2015-04-09: qty 2

## 2015-04-09 MED ORDER — DEXAMETHASONE SODIUM PHOSPHATE 4 MG/ML IJ SOLN
INTRAMUSCULAR | Status: DC | PRN
  Administered 2015-04-09: 10 mg via INTRAVENOUS

## 2015-04-09 MED ORDER — FENTANYL CITRATE (PF) 100 MCG/2ML IJ SOLN
50.0000 ug | INTRAMUSCULAR | Status: DC | PRN
  Administered 2015-04-09: 100 ug via INTRAVENOUS

## 2015-04-09 MED ORDER — LIDOCAINE HCL (CARDIAC) 20 MG/ML IV SOLN
INTRAVENOUS | Status: AC
  Filled 2015-04-09: qty 5

## 2015-04-09 MED ORDER — CHLORHEXIDINE GLUCONATE 4 % EX LIQD: 1.0000 "application " | Freq: Once | CUTANEOUS | Status: DC

## 2015-04-09 MED ORDER — MIDAZOLAM HCL 2 MG/2ML IJ SOLN
INTRAMUSCULAR | Status: DC | PRN
  Administered 2015-04-09: 2 mg via INTRAVENOUS

## 2015-04-09 MED ORDER — CEFAZOLIN SODIUM-DEXTROSE 2-3 GM-% IV SOLR
2.0000 g | INTRAVENOUS | Status: AC
  Administered 2015-04-09: 2 g via INTRAVENOUS

## 2015-04-09 MED ORDER — ONDANSETRON HCL 4 MG/2ML IJ SOLN: 4.0000 mg | Freq: Four times a day (QID) | INTRAMUSCULAR | Status: DC | PRN

## 2015-04-09 MED ORDER — PROPOFOL 10 MG/ML IV BOLUS
INTRAVENOUS | Status: AC
  Filled 2015-04-09: qty 20

## 2015-04-09 MED ORDER — FENTANYL CITRATE (PF) 100 MCG/2ML IJ SOLN
INTRAMUSCULAR | Status: AC
  Filled 2015-04-09: qty 4

## 2015-04-09 MED ORDER — MIDAZOLAM HCL 2 MG/2ML IJ SOLN
1.0000 mg | INTRAMUSCULAR | Status: DC | PRN
Start: 2015-04-09 — End: 2015-04-09
  Administered 2015-04-09: 2 mg via INTRAVENOUS

## 2015-04-09 MED ORDER — FENTANYL CITRATE (PF) 100 MCG/2ML IJ SOLN
INTRAMUSCULAR | Status: DC | PRN
  Administered 2015-04-09: 50 ug via INTRAVENOUS

## 2015-04-09 MED ORDER — PROPOFOL 10 MG/ML IV BOLUS
INTRAVENOUS | Status: DC | PRN
  Administered 2015-04-09: 200 mg via INTRAVENOUS

## 2015-04-09 MED ORDER — HYDROMORPHONE HCL 1 MG/ML IJ SOLN
INTRAMUSCULAR | Status: AC
  Filled 2015-04-09: qty 1

## 2015-04-09 MED ORDER — ONDANSETRON HCL 4 MG/2ML IJ SOLN
INTRAMUSCULAR | Status: AC
  Filled 2015-04-09: qty 2

## 2015-04-09 MED ORDER — BUPIVACAINE-EPINEPHRINE (PF) 0.5% -1:200000 IJ SOLN
INTRAMUSCULAR | Status: DC | PRN
  Administered 2015-04-09: 30 mL via PERINEURAL

## 2015-04-09 MED ORDER — MIDAZOLAM HCL 2 MG/2ML IJ SOLN
INTRAMUSCULAR | Status: AC
  Filled 2015-04-09: qty 4

## 2015-04-09 SURGICAL SUPPLY — 49 items
BLADE CLIPPER SURG (BLADE) ×4 IMPLANT
BLADE SURG 15 STRL LF DISP TIS (BLADE) ×2 IMPLANT
BLADE SURG 15 STRL SS (BLADE) ×2
CANISTER SUCT 1200ML W/VALVE (MISCELLANEOUS) IMPLANT
CHLORAPREP W/TINT 26ML (MISCELLANEOUS) ×4 IMPLANT
COVER BACK TABLE 60X90IN (DRAPES) ×4 IMPLANT
COVER MAYO STAND STRL (DRAPES) ×4 IMPLANT
DECANTER SPIKE VIAL GLASS SM (MISCELLANEOUS) IMPLANT
DRAIN PENROSE 1/2X12 LTX STRL (WOUND CARE) ×4 IMPLANT
DRAPE LAPAROTOMY TRNSV 102X78 (DRAPE) ×4 IMPLANT
DRAPE UTILITY XL STRL (DRAPES) ×4 IMPLANT
ELECT COATED BLADE 2.86 ST (ELECTRODE) ×4 IMPLANT
ELECT REM PT RETURN 9FT ADLT (ELECTROSURGICAL) ×4
ELECTRODE REM PT RTRN 9FT ADLT (ELECTROSURGICAL) ×2 IMPLANT
GAUZE SPONGE 4X4 16PLY XRAY LF (GAUZE/BANDAGES/DRESSINGS) IMPLANT
GLOVE BIO SURGEON STRL SZ 6.5 (GLOVE) ×6 IMPLANT
GLOVE BIO SURGEONS STRL SZ 6.5 (GLOVE) ×2
GLOVE BIOGEL PI IND STRL 7.0 (GLOVE) ×2 IMPLANT
GLOVE BIOGEL PI IND STRL 8 (GLOVE) ×2 IMPLANT
GLOVE BIOGEL PI INDICATOR 7.0 (GLOVE) ×2
GLOVE BIOGEL PI INDICATOR 8 (GLOVE) ×2
GLOVE ECLIPSE 8.0 STRL XLNG CF (GLOVE) ×4 IMPLANT
GOWN STRL REUS W/ TWL LRG LVL3 (GOWN DISPOSABLE) ×4 IMPLANT
GOWN STRL REUS W/TWL LRG LVL3 (GOWN DISPOSABLE) ×4
LIQUID BAND (GAUZE/BANDAGES/DRESSINGS) ×4 IMPLANT
MESH HERNIA SYS ULTRAPRO LRG (Mesh General) ×4 IMPLANT
NEEDLE HYPO 25X1 1.5 SAFETY (NEEDLE) ×4 IMPLANT
NS IRRIG 1000ML POUR BTL (IV SOLUTION) ×4 IMPLANT
PACK BASIN DAY SURGERY FS (CUSTOM PROCEDURE TRAY) ×4 IMPLANT
PENCIL BUTTON HOLSTER BLD 10FT (ELECTRODE) ×4 IMPLANT
SLEEVE SCD COMPRESS KNEE MED (MISCELLANEOUS) ×4 IMPLANT
SPONGE GAUZE 4X4 12PLY STER LF (GAUZE/BANDAGES/DRESSINGS) IMPLANT
SPONGE LAP 4X18 X RAY DECT (DISPOSABLE) ×4 IMPLANT
SUT MON AB 4-0 PC3 18 (SUTURE) ×4 IMPLANT
SUT NOVA 0 T19/GS 22DT (SUTURE) ×12 IMPLANT
SUT VIC AB 0 SH 27 (SUTURE) ×4 IMPLANT
SUT VIC AB 2-0 SH 27 (SUTURE) ×2
SUT VIC AB 2-0 SH 27XBRD (SUTURE) ×2 IMPLANT
SUT VIC AB 3-0 54X BRD REEL (SUTURE) IMPLANT
SUT VIC AB 3-0 BRD 54 (SUTURE)
SUT VICRYL 3-0 CR8 SH (SUTURE) ×4 IMPLANT
SUT VICRYL AB 2 0 TIE (SUTURE) IMPLANT
SUT VICRYL AB 2 0 TIES (SUTURE)
SYR CONTROL 10ML LL (SYRINGE) ×4 IMPLANT
TOWEL OR 17X24 6PK STRL BLUE (TOWEL DISPOSABLE) ×4 IMPLANT
TOWEL OR NON WOVEN STRL DISP B (DISPOSABLE) ×4 IMPLANT
TUBE CONNECTING 20'X1/4 (TUBING)
TUBE CONNECTING 20X1/4 (TUBING) IMPLANT
YANKAUER SUCT BULB TIP NO VENT (SUCTIONS) IMPLANT

## 2015-04-09 NOTE — Anesthesia Procedure Notes (Addendum)
Procedure Name: LMA Insertion Date/Time: 04/09/2015 8:35 AM Performed by: Toula Moos L Pre-anesthesia Checklist: Patient identified, Emergency Drugs available, Suction available, Patient being monitored and Timeout performed Patient Re-evaluated:Patient Re-evaluated prior to inductionOxygen Delivery Method: Circle System Utilized Preoxygenation: Pre-oxygenation with 100% oxygen Intubation Type: IV induction Ventilation: Mask ventilation without difficulty LMA: LMA inserted LMA Size: 5.0 Number of attempts: 1 Airway Equipment and Method: Bite block Placement Confirmation: positive ETCO2 Tube secured with: Tape Dental Injury: Teeth and Oropharynx as per pre-operative assessment    Anesthesia Regional Block:  TAP block  Pre-Anesthetic Checklist: ,, timeout performed, Correct Patient, Correct Site, Correct Laterality, Correct Procedure, Correct Position, site marked, Risks and benefits discussed,  Surgical consent,  Pre-op evaluation,  At surgeon's request and post-op pain management  Laterality: Right  Prep: chloraprep       Needles:  Injection technique: Single-shot  Needle Type: Echogenic Needle     Needle Length: 9cm 9 cm Needle Gauge: 21 and 21 G    Additional Needles:  Procedures: ultrasound guided (picture in chart) TAP block Narrative:  Start time: 04/09/2015 8:11 AM End time: 04/09/2015 8:19 AM Injection made incrementally with aspirations every 5 mL. Anesthesiologist: Loredana Medellin  Additional Notes: Pt tolerated the procedure well.

## 2015-04-09 NOTE — Op Note (Signed)
Right Inguinal Hernia, Open, Procedure Note mesh   Indications: The patient presented with a history of a right, reducible inguinal  hernia.    Pre-operative Diagnosis: right inguinal Hernia  reducible  Post-operative Diagnosis: same  Surgeon: Erroll Luna A.   Assistants: none   Anesthesia: General LMA anesthesia, Local anesthesia 0.25.% bupivacaine, with epinephrine and TEP block   ASA Class: 2  Procedure Details  The patient was seen again in the Holding Room. The risks, benefits, complications, treatment options, and expected outcomes were discussed with the patient. The possibilities of reaction to medication, pulmonary aspiration, perforation of viscus, bleeding, recurrent infection, the need for additional procedures, and development of a complication requiring transfusion or further operation were discussed with the patient and/or family. There was concurrence with the proposed plan, and informed consent was obtained. The site of surgery was properly noted/marked. The patient was taken to the Operating Room, identified as Samuel Lee, and the procedure verified as hernia repair. A Time Out was held and the above information confirmed.  The patient was placed in the supine position and underwent induction of anesthesia, the lower abdomen and groin was prepped and draped in the standard fashion, and 0.25% Marcaine with epinephrine was used to anesthetize the skin over the mid-portion of the inguinal canal. A transverse incision was made. Dissection was carried through the soft tissue to expose the inguinal canal and inguinal ligament along its lower edge. The external oblique fascia was split along the course of its fibers, exposing the inguinal canal. The cord and nerve were looped using a Penrose drain and reflected out of the field. The defect was exposed and a piece of prolene hernia system ultrapro mesh was and placed into  the  Direct defect. Interupted 0 novafil suture was then  used  to repair the defect, with the suture being sewn from the pubic tubercle inferiorly and superiorly along the canal to a level just beyond the internal ring. The mesh was split to allow passage of the cord  into the canal without entrapment. Ilioinguinal nerve was tethered and this was divided to prevent entrapment.   The contents were then returned to canal and the external oblique fashion was then closed in a continuous fashion using 3-0 Vicryl suture taking care not to cause entrapment. Scarpa's layer closed with 3 0 vicryl and 4 0 monocryl used to close the skin.  Dermabond used for dressing.  Instrument, sponge, and needle counts were correct prior to closure and at the conclusion of the case.  Findings: Hernia as above  Estimated Blood Loss: Minimal         Drains: None         Total IV Fluids: 600 mL         Specimens: none               Complications: None; patient tolerated the procedure well.         Disposition: PACU - hemodynamically stable.         Condition: stable

## 2015-04-09 NOTE — Anesthesia Postprocedure Evaluation (Signed)
Anesthesia Post Note  Patient: Samuel Lee  Procedure(s) Performed: Procedure(s) (LRB): RIGHT INGUINAL HERNIA REPAIR WITH MESH (Right) INSERTION OF MESH (Right)  Anesthesia type: General  Patient location: PACU  Post pain: Pain level controlled and Adequate analgesia  Post assessment: Post-op Vital signs reviewed, Patient's Cardiovascular Status Stable, Respiratory Function Stable, Patent Airway and Pain level controlled  Last Vitals:  Filed Vitals:   04/09/15 1030  BP: 132/88  Pulse: 79  Temp:   Resp: 12    Post vital signs: Reviewed and stable  Level of consciousness: awake, alert  and oriented  Complications: No apparent anesthesia complications

## 2015-04-09 NOTE — Anesthesia Preprocedure Evaluation (Signed)
Anesthesia Evaluation  Patient identified by MRN, date of birth, ID band Patient awake    Reviewed: Allergy & Precautions, NPO status , Patient's Chart, lab work & pertinent test results  Airway Mallampati: II   Neck ROM: full    Dental   Pulmonary neg pulmonary ROS,    breath sounds clear to auscultation       Cardiovascular negative cardio ROS   Rhythm:regular Rate:Normal     Neuro/Psych    GI/Hepatic   Endo/Other    Renal/GU      Musculoskeletal  (+) Arthritis ,   Abdominal   Peds  Hematology   Anesthesia Other Findings   Reproductive/Obstetrics                             Anesthesia Physical Anesthesia Plan  ASA: II  Anesthesia Plan: General and Regional   Post-op Pain Management:    Induction: Intravenous  Airway Management Planned: LMA  Additional Equipment:   Intra-op Plan:   Post-operative Plan:   Informed Consent: I have reviewed the patients History and Physical, chart, labs and discussed the procedure including the risks, benefits and alternatives for the proposed anesthesia with the patient or authorized representative who has indicated his/her understanding and acceptance.     Plan Discussed with: CRNA, Anesthesiologist and Surgeon  Anesthesia Plan Comments:         Anesthesia Quick Evaluation

## 2015-04-09 NOTE — Interval H&P Note (Signed)
History and Physical Interval Note:  04/09/2015 8:04 AM  Samuel Lee  has presented today for surgery, with the diagnosis of Right Inguinal Hernia REpair  The various methods of treatment have been discussed with the patient and family. After consideration of risks, benefits and other options for treatment, the patient has consented to  Procedure(s): RIGHT INGUINAL HERNIA REPAIR WITH MESH (Right) INSERTION OF MESH (Right) as a surgical intervention .  The patient's history has been reviewed, patient examined, no change in status, stable for surgery.  I have reviewed the patient's chart and labs.  Questions were answered to the patient's satisfaction.     [name]

## 2015-04-09 NOTE — Discharge Instructions (Signed)
[facility] _______[facility] ° °UMBILICAL OR INGUINAL HERNIA REPAIR: POST OP INSTRUCTIONS ° °Always review your discharge instruction sheet given to you by the facility where your surgery was performed. °IF YOU HAVE DISABILITY OR FAMILY LEAVE FORMS, YOU MUST BRING THEM TO THE OFFICE FOR PROCESSING.   °DO NOT GIVE THEM TO YOUR DOCTOR. ° °1. A  prescription for pain medication may be given to you upon discharge.  Take your pain medication as prescribed, if needed.  If narcotic pain medicine is not needed, then you may take acetaminophen (Tylenol) or ibuprofen (Advil) as needed. °2. Take your usually prescribed medications unless otherwise directed. °3. If you need a refill on your pain medication, please contact your pharmacy.  They will contact our office to request authorization. Prescriptions will not be filled after 5 pm or on week-ends. °4. You should follow a light diet the first 24 hours after arrival home, such as soup and crackers, etc.  Be sure to include lots of fluids daily.  Resume your normal diet the day after surgery. °5. Most patients will experience some swelling and bruising around the umbilicus or in the groin and scrotum.  Ice packs and reclining will help.  Swelling and bruising can take several days to resolve.  °6. It is common to experience some constipation if taking pain medication after surgery.  Increasing fluid intake and taking a stool softener (such as Colace) will usually help or prevent this problem from occurring.  A mild laxative (Milk of Magnesia or Miralax) should be taken according to package directions if there are no bowel movements after 48 hours. °7. Unless discharge instructions indicate otherwise, you may remove your bandages 24-48 hours after surgery, and you may shower at that time.  You may have steri-strips (small skin tapes) in place directly over the incision.  These strips should be left on the skin for 7-10 days.  If your surgeon used skin glue on the  incision, you may shower in 24 hours.  The glue will flake off over the next 2-3 weeks.  Any sutures or staples will be removed at the office during your follow-up visit. °8. ACTIVITIES:  You may resume regular (light) daily activities beginning the next day--such as daily self-care, walking, climbing stairs--gradually increasing activities as tolerated.  You may have sexual intercourse when it is comfortable.  Refrain from any heavy lifting or straining until approved by your doctor. °a. You may drive when you are no longer taking prescription pain medication, you can comfortably wear a seatbelt, and you can safely maneuver your car and apply brakes. °b. RETURN TO WORK:  __________________________________________________________ °9. You should see your doctor in the office for a follow-up appointment approximately 2-3 weeks after your surgery.  Make sure that you call for this appointment within a day or two after you arrive home to insure a convenient appointment time. °10. OTHER INSTRUCTIONS:  __________________________________________________________________________________________________________________________________________________________________________________________  °WHEN TO CALL YOUR DOCTOR: °1. Fever over 101.0 °2. Inability to urinate °3. Nausea and/or vomiting °4. Extreme swelling or bruising °5. Continued bleeding from incision. °6. Increased pain, redness, or drainage from the incision ° °The clinic staff is available to answer your questions during regular business hours.  Please don’t hesitate to call and ask to speak to one of the nurses for clinical concerns.  If you have a medical emergency, go to the nearest emergency room or call 911.  A surgeon from [facility] is always on call at the hospital ° ° °  [address] [place], [place], [place]  [address] ?  [address], [place], [place]   [phone] ? [phone] ? FAX [phone] Web site:  [email or web address] Post Anesthesia Home Care Instructions  Activity: Get plenty of rest for the remainder of the day. A responsible adult should stay with you for 24 hours following the procedure.  For the next 24 hours, DO NOT: -Drive a car -Paediatric nurse -Drink alcoholic beverages -Take any medication unless instructed by your physician -Make any legal decisions or sign important papers.  Meals: Start with liquid foods such as gelatin or soup. Progress to regular foods as tolerated. Avoid greasy, spicy, heavy foods. If nausea and/or vomiting occur, drink only clear liquids until the nausea and/or vomiting subsides. Call your physician if vomiting continues.  Special Instructions/Symptoms: Your throat may feel dry or sore from the anesthesia or the breathing tube placed in your throat during surgery. If this causes discomfort, gargle with warm salt water. The discomfort should disappear within 24 hours.  If you had a scopolamine patch placed behind your ear for the management of post- operative nausea and/or vomiting:  1. The medication in the patch is effective for 72 hours, after which it should be removed.  Wrap patch in a tissue and discard in the trash. Wash hands thoroughly with soap and water. 2. You may remove the patch earlier than 72 hours if you experience unpleasant side effects which may include dry mouth, dizziness or visual disturbances. 3. Avoid touching the patch. Wash your hands with soap and water after contact with the patch.   Call your surgeon if you experience:   1.  Fever over 101.0. 2.  Inability to urinate. 3.  Nausea and/or vomiting. 4.  Extreme swelling or bruising at the surgical site. 5.  Continued bleeding from the incision. 6.  Increased pain, redness or drainage from the incision. 7.  Problems related to your pain medication. 8. Any change in color, movement and/or sensation 9. Any problems and/or concerns

## 2015-04-09 NOTE — Transfer of Care (Signed)
Immediate Anesthesia Transfer of Care Note  Patient: Samuel Lee  Procedure(s) Performed: Procedure(s): RIGHT INGUINAL HERNIA REPAIR WITH MESH (Right) INSERTION OF MESH (Right)  Patient Location: PACU  Anesthesia Type:GA combined with regional for post-op pain  Level of Consciousness: sedated  Airway & Oxygen Therapy: Patient Spontanous Breathing and Patient connected to face mask oxygen  Post-op Assessment: Report given to RN and Post -op Vital signs reviewed and stable  Post vital signs: Reviewed and stable  Last Vitals:  Filed Vitals:   04/09/15 0820  BP: 140/72  Pulse: 79  Temp:   Resp: 13    Complications: No apparent anesthesia complications

## 2015-04-09 NOTE — H&P (Signed)
Samuel Lee 11/25/2014 3:01 PM Location: [facility] Patient #: [id number] DOB: 1951/06/04 Married / Language: English / Race: White Male  History of Present Illness Samuel Lee A. Virna Livengood MD; 11/25/2014 3:27 PM) Patient words: Samuel Lee    Samuel Lee is a 64 y.o. male. Patient presents from urgent care do to bulge right groin that is painful. Patient developed right groin pain 2 weeks ago after a weightlifting workout. He denies any swelling in his right groin. He does have intermittent right groin pain made worse with exertion and made better with rest. The pain can be severe at times sharp in nature without radiation location right groin. He is concerned he has a hernia. he was scheduled for surgery but cancelled last year. He would like to reschedule. Not having much pain but it is larger.  The patient is a 64 year old male    Past Surgical History [facility], [place]; 11/25/2014 3:01 PM) Hip Surgery Bilateral.  Allergies [facility], [place]; 11/25/2014 3:01 PM) No Known Drug Allergies05/08/2014  Medication History [facility], [place]; 11/25/2014 3:01 PM) No Current Medications Medications Reconciled  Social History [facility] S[facility], [place]; 11/25/2014 3:01 PM) Alcohol use Occasional alcohol use. No drug use Tobacco use Never smoker.  Family History [facility] Sellers, [place]; 11/25/2014 3:01 PM) First Degree Relatives No pertinent family history  Review of Systems Sharyn Lull R. Brooks CMA; 11/25/2014 3:01 PM) HEENT Not Present- Earache, Hearing Loss, Hoarseness, Nose Bleed, Oral Ulcers, Ringing in the Ears, Seasonal Allergies, Sinus Pain, Sore Throat, Visual Disturbances, Wears glasses/contact lenses and Yellow Eyes. Respiratory Not Present- Bloody sputum, Chronic Cough, Difficulty Breathing, Snoring and Wheezing.   Vitals [employer] R. Brooks CMA; 11/25/2014 3:01 PM) 11/25/2014 3:01 PM Weight: 202 lb Height: 69in Body Surface Area: 2.11 m Body Mass  Index: 29.83 kg/m BP: 122/78 (Sitting, Left Arm, Standard)    Physical Exam (Alistar Mcenery A. Italo Banton MD; 11/25/2014 3:26 PM) General Mental Status-Alert. General Appearance-Consistent with stated age. Hydration-Well hydrated. Voice-Normal.  Head and Neck Head-normocephalic, atraumatic with no lesions or palpable masses. Trachea-midline.  Chest and Lung Exam Chest and lung exam reveals -quiet, even and easy respiratory effort with no use of accessory muscles and on auscultation, normal breath sounds, no adventitious sounds and normal vocal resonance. Inspection Chest Wall - Normal. Back - normal.  Cardiovascular Cardiovascular examination reveals -normal heart sounds, regular rate and rhythm with no murmurs and normal pedal pulses bilaterally.  Abdomen Note: rih reducible moderate size no LIH   soft non tender    Neurologic Neurologic evaluation reveals -alert and oriented x 3 with no impairment of recent or remote memory. Mental Status-Normal.  Musculoskeletal Normal Exam - Left-Upper Extremity Strength Normal and Lower Extremity Strength Normal. Normal Exam - Right-Upper Extremity Strength Normal, Lower Extremity Weakness.    Assessment & Plan (Vianca Bracher A. Tehillah Cipriani MD; 11/25/2014 3:25 PM) RIGHT INGUINAL HERNIA (550.90  K40.90) Impression: Recommend repair right inguinal hernia with mesh.The risk of hernia repair include bleeding, Infection, Recurrence of the hernia, Mesh use, chronic pain, Organ injury, Bowel injury, Bladder injury, nerve injury with numbness around the incision, Death, and worsening of preexisting medical problems. The alternatives to surgery have been discussed as well.. Long term expectations of both operative and non operative treatments have been discussed. The patient agrees to proceed Current Plans  Pt Education - [facility] Umbilical/ Inguinal Hernia [facility]CI   Signed by Turner Daniels, MD (11/25/2014 3:28 PM)

## 2015-04-09 NOTE — Progress Notes (Signed)
AssistedDr. Hodierne with right, ultrasound guided, transabdominal plane block. Side rails up, monitors on throughout procedure. See vital signs in flow sheet. Tolerated Procedure well. ° °

## 2015-04-10 ENCOUNTER — Encounter (HOSPITAL_BASED_OUTPATIENT_CLINIC_OR_DEPARTMENT_OTHER): Payer: Self-pay | Admitting: Surgery

## 2017-10-09 ENCOUNTER — Encounter: Payer: Self-pay | Admitting: Gastroenterology

## 2017-12-26 ENCOUNTER — Telehealth: Payer: Self-pay

## 2017-12-26 NOTE — Telephone Encounter (Signed)
Sure

## 2017-12-26 NOTE — Telephone Encounter (Signed)
Please see note below from Dr. Henrene Pastor.

## 2017-12-26 NOTE — Telephone Encounter (Signed)
Patients wife walked in requesting her husband transfer care to Dr. Henrene Pastor. He is a former Dr. Sharlett Iles patient. His recall was reviewed by Dr. Loletha Carrow and he is due for a colonoscopy. She sees Dr. Henrene Pastor and would like for him to see him as well.

## 2017-12-26 NOTE — Telephone Encounter (Signed)
Dr. Henrene Pastor please see note below and advise if you will accept the pt.

## 2017-12-26 NOTE — Telephone Encounter (Signed)
If that is the patient's request, and Dr. Henrene Pastor is agreeable, of course it is fine with me.  - HD

## 2017-12-26 NOTE — Telephone Encounter (Signed)
Dr. Loletha Carrow you reviewed the colon recall on this pt. Wife requesting pt be switched to Dr. Henrene Pastor, she sees Dr. Henrene Pastor. Will you approve this switch? Please advise.

## 2017-12-27 NOTE — Telephone Encounter (Signed)
Left a message for the patient to call back and schedule with Dr. Henrene Pastor.

## 2018-08-29 ENCOUNTER — Encounter: Payer: Self-pay | Admitting: Internal Medicine

## 2018-10-27 ENCOUNTER — Encounter: Payer: PRIVATE HEALTH INSURANCE | Admitting: Internal Medicine

## 2018-11-17 ENCOUNTER — Encounter: Payer: PRIVATE HEALTH INSURANCE | Admitting: Internal Medicine

## 2018-11-29 ENCOUNTER — Telehealth: Payer: Self-pay | Admitting: *Deleted

## 2018-11-29 NOTE — Telephone Encounter (Signed)
LM on VM to reschedule postponed procedure.  Asked pt to call back to reschedule.

## 2018-12-06 NOTE — Telephone Encounter (Signed)
LMOM to call back to reschedule procedure.

## 2018-12-27 ENCOUNTER — Encounter: Payer: Self-pay | Admitting: Gastroenterology

## 2019-01-24 ENCOUNTER — Encounter: Payer: Self-pay | Admitting: Internal Medicine

## 2019-02-26 ENCOUNTER — Other Ambulatory Visit: Payer: Self-pay

## 2019-05-08 DIAGNOSIS — D225 Melanocytic nevi of trunk: Secondary | ICD-10-CM | POA: Diagnosis not present

## 2019-05-08 DIAGNOSIS — Z23 Encounter for immunization: Secondary | ICD-10-CM | POA: Diagnosis not present

## 2019-05-08 DIAGNOSIS — Z85820 Personal history of malignant melanoma of skin: Secondary | ICD-10-CM | POA: Diagnosis not present

## 2019-05-08 DIAGNOSIS — L814 Other melanin hyperpigmentation: Secondary | ICD-10-CM | POA: Diagnosis not present

## 2019-05-08 DIAGNOSIS — Z86018 Personal history of other benign neoplasm: Secondary | ICD-10-CM | POA: Diagnosis not present

## 2019-05-08 DIAGNOSIS — L821 Other seborrheic keratosis: Secondary | ICD-10-CM | POA: Diagnosis not present

## 2019-05-08 DIAGNOSIS — Z85828 Personal history of other malignant neoplasm of skin: Secondary | ICD-10-CM | POA: Diagnosis not present

## 2019-05-08 DIAGNOSIS — D485 Neoplasm of uncertain behavior of skin: Secondary | ICD-10-CM | POA: Diagnosis not present

## 2019-05-08 DIAGNOSIS — B079 Viral wart, unspecified: Secondary | ICD-10-CM | POA: Diagnosis not present

## 2019-08-14 ENCOUNTER — Ambulatory Visit: Payer: PRIVATE HEALTH INSURANCE | Attending: Internal Medicine

## 2019-08-14 DIAGNOSIS — Z23 Encounter for immunization: Secondary | ICD-10-CM | POA: Insufficient documentation

## 2019-08-14 NOTE — Progress Notes (Signed)
   Covid-19 Vaccination Clinic  Name:  Samuel Lee    MRN: [id number] DOB: 1951-04-15  08/14/2019  Mr. [name] was observed post Covid-19 immunization for 15 minutes without incidence. He was provided with Vaccine Information Sheet and instruction to access the V-Safe system.   Mr. [name] was instructed to call 911 with any severe reactions post vaccine: Marland Kitchen Difficulty breathing  . Swelling of your face and throat  . A fast heartbeat  . A bad rash all over your body  . Dizziness and weakness    Immunizations Administered    Name Date Dose VIS Date Route   Pfizer COVID-19 Vaccine 08/14/2019  5:35 PM 0.3 mL 07/06/2019 Intramuscular   Manufacturer: [place]   Lot: [id number]   [place]: [id number]

## 2019-09-02 ENCOUNTER — Ambulatory Visit: Payer: PRIVATE HEALTH INSURANCE | Attending: Internal Medicine

## 2019-09-02 DIAGNOSIS — Z23 Encounter for immunization: Secondary | ICD-10-CM

## 2019-09-02 NOTE — Progress Notes (Signed)
   Covid-19 Vaccination Clinic  Name:  Samuel Lee    MRN: [id number] DOB: July 09, 1951  09/02/2019  Samuel Lee was observed post Covid-19 immunization for 15 minutes without incidence. He was provided with Vaccine Information Sheet and instruction to access the V-Safe system.   Samuel Lee was instructed to call 911 with any severe reactions post vaccine: Marland Kitchen Difficulty breathing  . Swelling of your face and throat  . A fast heartbeat  . A bad rash all over your body  . Dizziness and weakness    Immunizations Administered    Name Date Dose VIS Date Route   Pfizer COVID-19 Vaccine 09/02/2019 10:42 AM 0.3 mL 07/06/2019 Intramuscular   Manufacturer: [employer]   Lot: [id number]   [place]: [id number]

## 2020-03-10 DIAGNOSIS — M79671 Pain in right foot: Secondary | ICD-10-CM | POA: Diagnosis not present

## 2020-03-10 DIAGNOSIS — R350 Frequency of micturition: Secondary | ICD-10-CM | POA: Diagnosis not present

## 2020-03-10 DIAGNOSIS — M19071 Primary osteoarthritis, right ankle and foot: Secondary | ICD-10-CM | POA: Diagnosis not present

## 2020-03-10 DIAGNOSIS — M79672 Pain in left foot: Secondary | ICD-10-CM | POA: Diagnosis not present

## 2020-03-10 DIAGNOSIS — Z7289 Other problems related to lifestyle: Secondary | ICD-10-CM | POA: Diagnosis not present

## 2020-04-08 DIAGNOSIS — R350 Frequency of micturition: Secondary | ICD-10-CM | POA: Diagnosis not present

## 2020-04-11 DIAGNOSIS — N400 Enlarged prostate without lower urinary tract symptoms: Secondary | ICD-10-CM | POA: Diagnosis not present

## 2020-04-11 DIAGNOSIS — R030 Elevated blood-pressure reading, without diagnosis of hypertension: Secondary | ICD-10-CM | POA: Diagnosis not present

## 2020-04-11 DIAGNOSIS — Z7289 Other problems related to lifestyle: Secondary | ICD-10-CM | POA: Diagnosis not present

## 2020-05-05 ENCOUNTER — Other Ambulatory Visit: Payer: Self-pay

## 2020-05-05 ENCOUNTER — Inpatient Hospital Stay (HOSPITAL_COMMUNITY): Payer: PPO

## 2020-05-05 ENCOUNTER — Emergency Department (HOSPITAL_COMMUNITY): Payer: PPO

## 2020-05-05 ENCOUNTER — Encounter (HOSPITAL_COMMUNITY): Payer: Self-pay | Admitting: Neurology

## 2020-05-05 ENCOUNTER — Inpatient Hospital Stay (HOSPITAL_COMMUNITY)
Admission: EM | Admit: 2020-05-05 | Discharge: 2020-05-11 | DRG: 101 | Disposition: A | Discharge status: Home Health | Payer: PPO | Attending: Neurology | Admitting: Neurology

## 2020-05-05 DIAGNOSIS — M1990 Unspecified osteoarthritis, unspecified site: Secondary | ICD-10-CM | POA: Diagnosis present

## 2020-05-05 DIAGNOSIS — E785 Hyperlipidemia, unspecified: Secondary | ICD-10-CM | POA: Diagnosis present

## 2020-05-05 DIAGNOSIS — Z23 Encounter for immunization: Secondary | ICD-10-CM

## 2020-05-05 DIAGNOSIS — Z20822 Contact with and (suspected) exposure to covid-19: Secondary | ICD-10-CM | POA: Diagnosis not present

## 2020-05-05 DIAGNOSIS — Z7289 Other problems related to lifestyle: Secondary | ICD-10-CM | POA: Diagnosis not present

## 2020-05-05 DIAGNOSIS — Z791 Long term (current) use of non-steroidal anti-inflammatories (NSAID): Secondary | ICD-10-CM | POA: Diagnosis not present

## 2020-05-05 DIAGNOSIS — R29706 NIHSS score 6: Secondary | ICD-10-CM | POA: Diagnosis not present

## 2020-05-05 DIAGNOSIS — E876 Hypokalemia: Secondary | ICD-10-CM | POA: Diagnosis not present

## 2020-05-05 DIAGNOSIS — I639 Cerebral infarction, unspecified: Secondary | ICD-10-CM | POA: Diagnosis not present

## 2020-05-05 DIAGNOSIS — R338 Other retention of urine: Secondary | ICD-10-CM | POA: Diagnosis not present

## 2020-05-05 DIAGNOSIS — Z85820 Personal history of malignant melanoma of skin: Secondary | ICD-10-CM | POA: Diagnosis not present

## 2020-05-05 DIAGNOSIS — I679 Cerebrovascular disease, unspecified: Secondary | ICD-10-CM | POA: Diagnosis not present

## 2020-05-05 DIAGNOSIS — R4781 Slurred speech: Secondary | ICD-10-CM | POA: Diagnosis not present

## 2020-05-05 DIAGNOSIS — I6300 Cerebral infarction due to thrombosis of unspecified precerebral artery: Secondary | ICD-10-CM | POA: Diagnosis not present

## 2020-05-05 DIAGNOSIS — I429 Cardiomyopathy, unspecified: Secondary | ICD-10-CM | POA: Diagnosis present

## 2020-05-05 DIAGNOSIS — I10 Essential (primary) hypertension: Secondary | ICD-10-CM | POA: Diagnosis not present

## 2020-05-05 DIAGNOSIS — R110 Nausea: Secondary | ICD-10-CM | POA: Diagnosis not present

## 2020-05-05 DIAGNOSIS — R339 Retention of urine, unspecified: Secondary | ICD-10-CM | POA: Diagnosis not present

## 2020-05-05 DIAGNOSIS — G8194 Hemiplegia, unspecified affecting left nondominant side: Secondary | ICD-10-CM | POA: Diagnosis present

## 2020-05-05 DIAGNOSIS — N401 Enlarged prostate with lower urinary tract symptoms: Secondary | ICD-10-CM | POA: Diagnosis not present

## 2020-05-05 DIAGNOSIS — R569 Unspecified convulsions: Secondary | ICD-10-CM

## 2020-05-05 DIAGNOSIS — R0902 Hypoxemia: Secondary | ICD-10-CM | POA: Diagnosis not present

## 2020-05-05 DIAGNOSIS — I6389 Other cerebral infarction: Secondary | ICD-10-CM | POA: Diagnosis not present

## 2020-05-05 DIAGNOSIS — R29810 Facial weakness: Secondary | ICD-10-CM | POA: Diagnosis present

## 2020-05-05 DIAGNOSIS — R42 Dizziness and giddiness: Secondary | ICD-10-CM | POA: Diagnosis not present

## 2020-05-05 DIAGNOSIS — R471 Dysarthria and anarthria: Secondary | ICD-10-CM | POA: Diagnosis present

## 2020-05-05 DIAGNOSIS — R29898 Other symptoms and signs involving the musculoskeletal system: Secondary | ICD-10-CM | POA: Diagnosis not present

## 2020-05-05 DIAGNOSIS — Z8249 Family history of ischemic heart disease and other diseases of the circulatory system: Secondary | ICD-10-CM | POA: Diagnosis not present

## 2020-05-05 DIAGNOSIS — Y900 Blood alcohol level of less than 20 mg/100 ml: Secondary | ICD-10-CM | POA: Diagnosis present

## 2020-05-05 DIAGNOSIS — I351 Nonrheumatic aortic (valve) insufficiency: Secondary | ICD-10-CM

## 2020-05-05 DIAGNOSIS — Z8673 Personal history of transient ischemic attack (TIA), and cerebral infarction without residual deficits: Secondary | ICD-10-CM | POA: Diagnosis not present

## 2020-05-05 DIAGNOSIS — R1310 Dysphagia, unspecified: Secondary | ICD-10-CM | POA: Diagnosis not present

## 2020-05-05 DIAGNOSIS — R531 Weakness: Secondary | ICD-10-CM | POA: Diagnosis not present

## 2020-05-05 DIAGNOSIS — R000 Tachycardia, unspecified: Secondary | ICD-10-CM | POA: Diagnosis not present

## 2020-05-05 DIAGNOSIS — Z9282 Status post administration of tPA (rtPA) in a different facility within the last 24 hours prior to admission to current facility: Secondary | ICD-10-CM | POA: Diagnosis not present

## 2020-05-05 LAB — COMPREHENSIVE METABOLIC PANEL
ALT: 19 U/L (ref 0–44)
AST: 32 U/L (ref 15–41)
Albumin: 4.6 g/dL (ref 3.5–5.0)
Alkaline Phosphatase: 46 U/L (ref 38–126)
Anion gap: 13 (ref 5–15)
BUN: 17 mg/dL (ref 8–23)
CO2: 21 mmol/L — ABNORMAL LOW (ref 22–32)
Calcium: 9.1 mg/dL (ref 8.9–10.3)
Chloride: 105 mmol/L (ref 98–111)
Creatinine, Ser: 0.87 mg/dL (ref 0.61–1.24)
GFR, Estimated: >60 mL/min (ref >60)
Glucose, Bld: 145 mg/dL — ABNORMAL HIGH (ref 70–99)
Potassium: 3.7 mmol/L (ref 3.5–5.1)
Sodium: 139 mmol/L (ref 135–145)
Total Bilirubin: 0.9 mg/dL (ref 0.3–1.2)
Total Protein: 6.9 g/dL (ref 6.5–8.1)

## 2020-05-05 LAB — DIFFERENTIAL
Abs Immature Granulocytes: 0.02 10*3/uL (ref 0.00–0.07)
Basophils Absolute: 0 10*3/uL (ref 0.0–0.1)
Basophils Relative: 0 %
Eosinophils Absolute: 0 10*3/uL (ref 0.0–0.5)
Eosinophils Relative: 0 %
Immature Granulocytes: 0 %
Lymphocytes Relative: 14 %
Lymphs Abs: 1 10*3/uL (ref 0.7–4.0)
Monocytes Absolute: 0.3 10*3/uL (ref 0.1–1.0)
Monocytes Relative: 5 %
Neutro Abs: 6.2 10*3/uL (ref 1.7–7.7)
Neutrophils Relative %: 81 %

## 2020-05-05 LAB — I-STAT CHEM 8, ED
BUN: 20 mg/dL (ref 8–23)
Calcium, Ion: 1.05 mmol/L — ABNORMAL LOW (ref 1.15–1.40)
Chloride: 106 mmol/L (ref 98–111)
Creatinine, Ser: 0.7 mg/dL (ref 0.61–1.24)
Glucose, Bld: 150 mg/dL — ABNORMAL HIGH (ref 70–99)
HCT: 45 % (ref 39.0–52.0)
Hemoglobin: 15.3 g/dL (ref 13.0–17.0)
Potassium: 3.7 mmol/L (ref 3.5–5.1)
Sodium: 140 mmol/L (ref 135–145)
TCO2: 24 mmol/L (ref 22–32)

## 2020-05-05 LAB — RAPID URINE DRUG SCREEN, HOSP PERFORMED
Amphetamines: NOT DETECTED
Barbiturates: NOT DETECTED
Benzodiazepines: NOT DETECTED
Cocaine: NOT DETECTED
Opiates: NOT DETECTED
Tetrahydrocannabinol: NOT DETECTED

## 2020-05-05 LAB — PROTIME-INR
INR: 1.2 (ref 0.8–1.2)
Prothrombin Time: 14.6 seconds (ref 11.4–15.2)

## 2020-05-05 LAB — ECHOCARDIOGRAM COMPLETE
Area-P 1/2: 2.5 cm2
Calc EF: 42.6 %
Height: 69 in
P 1/2 time: 572 msec
S' Lateral: 3.8 cm
Single Plane A2C EF: 49 %
Single Plane A4C EF: 42.7 %
Weight: 2920.65 oz

## 2020-05-05 LAB — ETHANOL: Alcohol, Ethyl (B): <10 mg/dL (ref <10)

## 2020-05-05 LAB — CBC
HCT: 47.7 % (ref 39.0–52.0)
Hemoglobin: 16.3 g/dL (ref 13.0–17.0)
MCH: 31.8 pg (ref 26.0–34.0)
MCHC: 34.2 g/dL (ref 30.0–36.0)
MCV: 93 fL (ref 80.0–100.0)
Platelets: 154 10*3/uL (ref 150–400)
RBC: 5.13 MIL/uL (ref 4.22–5.81)
RDW: 11.9 % (ref 11.5–15.5)
WBC: 7.6 10*3/uL (ref 4.0–10.5)
nRBC: 0 % (ref 0.0–0.2)

## 2020-05-05 LAB — URINALYSIS, ROUTINE W REFLEX MICROSCOPIC
Bilirubin Urine: NEGATIVE
Glucose, UA: NEGATIVE mg/dL
Hgb urine dipstick: NEGATIVE
Ketones, ur: NEGATIVE mg/dL
Leukocytes,Ua: NEGATIVE
Nitrite: NEGATIVE
Protein, ur: NEGATIVE mg/dL
Specific Gravity, Urine: 1.038 — ABNORMAL HIGH (ref 1.005–1.030)
pH: 6 (ref 5.0–8.0)

## 2020-05-05 LAB — RESPIRATORY PANEL BY RT PCR (FLU A&B, COVID)
Influenza A by PCR: NEGATIVE
Influenza B by PCR: NEGATIVE
SARS Coronavirus 2 by RT PCR: NEGATIVE

## 2020-05-05 LAB — MRSA PCR SCREENING: MRSA by PCR: NEGATIVE

## 2020-05-05 LAB — APTT: aPTT: 23 seconds — ABNORMAL LOW (ref 24–36)

## 2020-05-05 MED ORDER — SODIUM CHLORIDE 0.9 % IV SOLN
50.0000 mL | Freq: Once | INTRAVENOUS | Status: AC
  Administered 2020-05-05: 50 mL via INTRAVENOUS

## 2020-05-05 MED ORDER — CLEVIDIPINE BUTYRATE 0.5 MG/ML IV EMUL
0.0000 mg/h | INTRAVENOUS | Status: DC
  Administered 2020-05-06: 2 mg/h via INTRAVENOUS
  Administered 2020-05-06: 4 mg/h via INTRAVENOUS
  Filled 2020-05-05 (×2): qty 50

## 2020-05-05 MED ORDER — LABETALOL HCL 5 MG/ML IV SOLN
20.0000 mg | Freq: Once | INTRAVENOUS | Status: AC
  Administered 2020-05-05: 20 mg via INTRAVENOUS
  Filled 2020-05-05: qty 4

## 2020-05-05 MED ORDER — ALTEPLASE (STROKE) FULL DOSE INFUSION
0.9000 mg/kg | Freq: Once | INTRAVENOUS | Status: AC
  Administered 2020-05-05: 73 mg via INTRAVENOUS
  Filled 2020-05-05: qty 100

## 2020-05-05 MED ORDER — ACETAMINOPHEN 325 MG PO TABS: 650.0000 mg | ORAL_TABLET | ORAL | Status: DC | PRN

## 2020-05-05 MED ORDER — SENNOSIDES-DOCUSATE SODIUM 8.6-50 MG PO TABS: 1.0000 | ORAL_TABLET | Freq: Every evening | ORAL | Status: DC | PRN

## 2020-05-05 MED ORDER — IOHEXOL 300 MG/ML  SOLN
60.0000 mL | Freq: Once | INTRAMUSCULAR | Status: AC | PRN
  Administered 2020-05-05: 60 mL via INTRAVENOUS

## 2020-05-05 MED ORDER — ACETAMINOPHEN 160 MG/5ML PO SOLN: 650.0000 mg | ORAL | Status: DC | PRN

## 2020-05-05 MED ORDER — SODIUM CHLORIDE 0.9 % IV SOLN: INTRAVENOUS | Status: DC

## 2020-05-05 MED ORDER — ACETAMINOPHEN 650 MG RE SUPP: 650.0000 mg | RECTAL | Status: DC | PRN

## 2020-05-05 MED ORDER — STROKE: EARLY STAGES OF RECOVERY BOOK
Freq: Once | Status: DC
  Filled 2020-05-05: qty 1

## 2020-05-05 MED ORDER — PANTOPRAZOLE SODIUM 40 MG IV SOLR
40.0000 mg | Freq: Every day | INTRAVENOUS | Status: DC
  Administered 2020-05-05 – 2020-05-06 (×2): 40 mg via INTRAVENOUS
  Filled 2020-05-05 (×2): qty 40

## 2020-05-05 MED ORDER — CHLORHEXIDINE GLUCONATE CLOTH 2 % EX PADS
6.0000 | MEDICATED_PAD | Freq: Every day | CUTANEOUS | Status: DC
  Administered 2020-05-05 – 2020-05-10 (×6): 6 via TOPICAL

## 2020-05-05 NOTE — ED Triage Notes (Signed)
Patient presents to ed via [facility] per his wife patient was last seen normal at 0930 states he has facial droop on the left  With left sided weakness denies seizure actiivey, no incont. Speech is slurred.  Patient is currently alert and oriented. Patient normally is alert oriented and ambulatory.

## 2020-05-05 NOTE — H&P (Signed)
Neurology H&P  Reason for Consult: Code stroke Referring Physician: Tegeler  CC: Left-sided weakness  History is obtained from: EMS  HPI: Samuel Lee is a 69 y.o. male with history of melanoma and arthritis.  Patient, per wife awoke at 930 and he was at his baseline.  Wife went to go do something in the house and when returned she noted that he could not get up out of a chair secondary to left-sided weakness.  She also noted left-sided facial droop and slurred speech.  EMS was called.  Per wife patient at baseline is completely normal, walks without a problem and never had any weakness.  On arrival he was noted to have a left facial droop, slurred speech, left arm and leg weakness.  Blood pressure was 181/100, EKG showed normal sinus rhythm.  On arrival patient appeared to have some improvement with his left-sided weakness.  He continued to have slurred speech.  He had no risk factors for TPA.  tPA was administered.  LKW: 9:30 AM tpa given?:  Yes Premorbid modified Rankin scale (mRS): 0  NIHSS 1a Level of Conscious.: 0 1b LOC Questions:0  1c LOC Commands: 0 2 Best Gaze: 0 3 Visual: 0 4 Facial Palsy: 1 5a Motor Arm - left: 0 5b Motor Arm - Right:0  6a Motor Leg - Left: 0 6b Motor Leg - Right: 0 7 Limb Ataxia: 2 8 Sensory: 0 9 Best Language: 1 10 Dysarthria: 2 11 Extinct. and Inatten.:  TOTAL: 6   Past Medical History:  Diagnosis Date  . Arthritis   . Cancer ([place])    melanoma     Family History  Problem Relation Age of Onset  . Hypertension Mother   . Hypertension Father    Social History:   reports that he has never smoked. He has never used smokeless tobacco. He reports current alcohol use. He reports that he does not use drugs.  Medications  Current Facility-Administered Medications:  .  alteplase (ACTIVASE) 1 mg/mL infusion 73 mg, 0.9 mg/kg, Intravenous, Once **FOLLOWED BY** 0.9 %  sodium chloride infusion, 50 mL, Intravenous, Once, Donnetta Simpers,  MD  Current Outpatient Medications:  .  Ibuprofen-Diphenhydramine HCl (ADVIL PM) 200-25 MG CAPS, Take by mouth., Disp: , Rfl:  .  naproxen sodium (ANAPROX) 220 MG tablet, Take 220 mg by mouth 2 (two) times daily with a meal., Disp: , Rfl:  .  oxyCODONE-acetaminophen (ROXICET) 5-325 MG per tablet, Take 1-2 tablets by mouth every 4 (four) hours as needed., Disp: 30 tablet, Rfl: 0  ROS:  Unable to obtain due to altered mental status.   General ROS: negative for - chills, fatigue, fever, night sweats, weight gain or weight loss Psychological ROS: negative for - behavioral disorder, hallucinations, memory difficulties, mood swings or suicidal ideation Ophthalmic ROS: negative for - blurry vision, double vision, eye pain or loss of vision ENT ROS: negative for - epistaxis, nasal discharge, oral lesions, sore throat, tinnitus or vertigo Respiratory ROS: negative for - cough, hemoptysis, shortness of breath or wheezing Cardiovascular ROS: negative for - chest pain, dyspnea on exertion, edema or irregular heartbeat Gastrointestinal ROS: negative for - abdominal pain, diarrhea, hematemesis, nausea/vomiting or stool incontinence Genito-Urinary ROS: negative for - dysuria, hematuria, incontinence or urinary frequency/urgency Musculoskeletal ROS: Positive for - r muscular weakness Neurological ROS: as noted in HPI Dermatological ROS: negative for rash and skin lesion changes  Exam: Current vital signs: BP (!) 152/96 (BP Location: Right Arm)   Pulse 94   Temp Marland Kitchen)  96.9 F (36.1 C) (Temporal)   Resp 20   Ht '5\' 9"'  (1.753 m)   Wt 81.1 kg   SpO2 98%   BMI 26.40 kg/m  Vital signs in last 24 hours: Temp:  [96.9 F (36.1 C)] 96.9 F (36.1 C) (10/11 1100) Pulse Rate:  [94] 94 (10/11 1100) Resp:  [20] 20 (10/11 1100) BP: (152)/(96) 152/96 (10/11 1100) SpO2:  [97 %-98 %] 98 % (10/11 1107) Weight:  [81.1 kg] 81.1 kg (10/11 1114)   Constitutional: Appears well-developed and well-nourished.  Psych:  Affect appropriate to situation Eyes: No scleral injection HENT: No OP obstrucion Head: Normocephalic.  Cardiovascular: Normal rate and regular rhythm.  Respiratory: Effort normal, non-labored breathing GI: Soft.  No distension. There is no tenderness.  Skin: WDI  Neuro: Mental Status: Patient is alert, oriented, he does have dysarthria and aphasia with difficulty recognizing objects.  Patient is able to follow commands without difficult   Cranial Nerves: II: Visual Fields are full.  III,IV, VI: EOMI without ptosis or diploplia. Pupils equal, round and reactive to light V: Facial sensation is symmetric to temperature VII: Slight left facial droop VIII: hearing is intact to voice X: Palat elevates symmetrically XI: Shoulder shrug is symmetric. XII: tongue is midline without atrophy or fasciculations.  Motor: Tone is normal. Bulk is normal. 5/5 strength on right arm and leg.  Left arm and leg is a 4/5 No drift No aterixis Sensory: Sensation is symmetric to light touch and temperature in the arms and legs. Deep Tendon Reflexes: 2+ and symmetric in the biceps and patellae.  Plantars: Toes are downgoing bilaterally.  Cerebellar: Finger-nose and heel-to-shin both showed ataxia bilaterally  Labs I have reviewed labs in epic and the results pertinent to this consultation are:   CBC    Component Value Date/Time   WBC 7.6 05/05/2020 1036   RBC 5.13 05/05/2020 1036   HGB 15.3 05/05/2020 1047   HCT 45.0 05/05/2020 1047   PLT 154 05/05/2020 1036   MCV 93.0 05/05/2020 1036   MCH 31.8 05/05/2020 1036   MCHC 34.2 05/05/2020 1036   RDW 11.9 05/05/2020 1036   LYMPHSABS 1.0 05/05/2020 1036   MONOABS 0.3 05/05/2020 1036   EOSABS 0.0 05/05/2020 1036   BASOSABS 0.0 05/05/2020 1036    CMP     Component Value Date/Time   NA 140 05/05/2020 1047   K 3.7 05/05/2020 1047   CL 106 05/05/2020 1047   CO2 30 04/05/2007 0435   GLUCOSE 150 (H) 05/05/2020 1047   BUN 20 05/05/2020 1047    CREATININE 0.70 05/05/2020 1047   CALCIUM 7.9 (L) 04/05/2007 0435   PROT 6.5 03/28/2007 1325   ALBUMIN 3.9 03/28/2007 1325   AST 19 03/28/2007 1325   ALT 20 03/28/2007 1325   ALKPHOS 50 03/28/2007 1325   BILITOT 0.9 03/28/2007 1325   GFRNONAA >60 04/05/2007 0435   GFRAA  04/05/2007 0435    >60        The eGFR has been calculated using the MDRD equation. This calculation has not been validated in all clinical    Imaging I have reviewed the images obtained:  CT-scan of the brain-no acute cranial abnormality with an aspects of 10.  CTA head and neck-no large or medium vessel occlusion.  No significant carotid bifurcation disease.  Mild atherosclerotic disease in both vertebral V4 segments but without stenosis greater than 30%.  Etta Quill PA-C [facility] [phone]  M-F  (9:00 am- 5:00 PM)  05/05/2020, 11:22 AM  Assessment: This 69 year old male presenting to [facility] as a code stroke for left facial droop, slurred speech, left-sided weakness.  On arrival patient continued to have left-sided weakness, dysarthria along with aphasia as he was unable to name objects, and left facial droop.  Patient was a candidate for TPA and did receive TPA.  Patient will be admitted to ICU and watch closely by neurology.  Impression: -Stroke  Plan:  Acute Ischemic Stroke   Acuity: Acute -Admit to: ICU -Hold Aspirin until 24 hour post tPA neuroimaging is stable and without evidence of bleeding -Blood pressure control, goal of SYS <180 -MRI/ECHO/A1C/Lipid panel. -Hyperglycemia management per SSI to maintain glucose 140-123m/dL. -PT/OT/ST therapies and recommendations when able  CNS -Close neuro monitoring  Dysarthria -NPO until cleared by speech -ST -Advance diet as tolerated  CV -Aggressive BP control, goal SBP <180 -Titrate oral agents if needed titrate IV agents  Hyperlipidemia, unspecified  - Statin for goal LDL < 70   HEME No active  issues -Monitor -transfuse for hgb < 7   ENDO -goal HgbA1c < 7   Prophylaxis DVT: SCD GI: Protonix Bowel: Senokot  Diet: NPO until cleared by speech  Code Status: Full Code    Delays in this process: (None)

## 2020-05-05 NOTE — ED Provider Notes (Signed)
[facility] EMERGENCY DEPARTMENT Provider Note   CSN: [id number] Arrival date & time: 05/05/20  1034     History Chief Complaint  Patient presents with  . Code Stroke  Code Sroke   Samuel Lee is a 69 y.o. male.  The history is provided by the EMS personnel, the spouse, the patient and medical records. The history is limited by the condition of the patient. No language interpreter was used.  Cerebrovascular Accident This is a new problem. The current episode started 1 to 2 hours ago. The problem occurs constantly. The problem has not changed since onset.Pertinent negatives include no chest pain, no abdominal pain, no headaches and no shortness of breath. Nothing aggravates the symptoms. Nothing relieves the symptoms. He has tried nothing for the symptoms. The treatment provided no relief.       Past Medical History:  Diagnosis Date  . Arthritis   . Cancer ([place])    melanoma    There are no problems to display for this patient.   Past Surgical History:  Procedure Laterality Date  . HIP SURGERY  03/2010   bilaterally hip  . INGUINAL HERNIA REPAIR Right 04/09/2015   Procedure: RIGHT INGUINAL HERNIA REPAIR WITH MESH;  Surgeon: Erroll Luna, MD;  Location: [facility];  Service: General;  Laterality: Right;  . INSERTION OF MESH Right 04/09/2015   Procedure: INSERTION OF MESH;  Surgeon: Erroll Luna, MD;  Location: [facility];  Service: General;  Laterality: Right;  . MELANOMA EXCISION  09/2010   nose       No family history on file.  Social History   Tobacco Use  . Smoking status: Never Smoker  . Smokeless tobacco: Never Used  Substance Use Topics  . Alcohol use: Yes  . Drug use: No    Home Medications Prior to Admission medications   Medication Sig Start Date End Date Taking? Authorizing Provider  Ibuprofen-Diphenhydramine HCl (ADVIL PM) 200-25 MG CAPS Take by mouth.    [provider]  naproxen  sodium (ANAPROX) 220 MG tablet Take 220 mg by mouth 2 (two) times daily with a meal.    [provider]  oxyCODONE-acetaminophen (ROXICET) 5-325 MG per tablet Take 1-2 tablets by mouth every 4 (four) hours as needed. 04/09/15   Erroll Luna, MD    Allergies    Patient has no known allergies.  Review of Systems   Review of Systems  Unable to perform ROS: Acuity of condition  Constitutional: Negative for chills, diaphoresis, fatigue and fever.  HENT: Negative for congestion.   Respiratory: Negative for cough, chest tightness and shortness of breath.   Cardiovascular: Negative for chest pain.  Gastrointestinal: Negative for abdominal pain, constipation, diarrhea, nausea and vomiting.  Musculoskeletal: Negative for back pain, neck pain and neck stiffness.  Skin: Negative for wound.  Neurological: Positive for speech difficulty and weakness. Negative for dizziness, light-headedness, numbness and headaches.  All other systems reviewed and are negative.   Physical Exam Updated Vital Signs There were no vitals taken for this visit.  Physical Exam Vitals and nursing note reviewed.  Constitutional:      General: He is not in acute distress.    Appearance: He is well-developed. He is not ill-appearing, toxic-appearing or diaphoretic.  HENT:     Head: Atraumatic.     Nose: No congestion or rhinorrhea.     Mouth/Throat:     Pharynx: No oropharyngeal exudate or posterior oropharyngeal erythema.  Eyes:  Extraocular Movements: Extraocular movements intact.     Conjunctiva/sclera: Conjunctivae normal.     Pupils: Pupils are equal, round, and reactive to light.  Cardiovascular:     Rate and Rhythm: Normal rate and regular rhythm.     Heart sounds: No murmur heard.   Pulmonary:     Effort: Pulmonary effort is normal. No respiratory distress.     Breath sounds: Normal breath sounds. No wheezing, rhonchi or rales.  Chest:     Chest wall: No tenderness.  Abdominal:      General: Abdomen is flat.     Palpations: Abdomen is soft.     Tenderness: There is no abdominal tenderness. There is no guarding or rebound.  Musculoskeletal:        General: No tenderness or signs of injury.     Cervical back: Neck supple. No tenderness.  Skin:    General: Skin is warm and dry.     Capillary Refill: Capillary refill takes less than 2 seconds.     Findings: No erythema.  Neurological:     Mental Status: He is alert.     Cranial Nerves: Dysarthria and facial asymmetry present.     Sensory: No sensory deficit.     Motor: Weakness (face) present.     Comments: Patient had left facial droop compared to right.  No sensory deficits on my initial exam.  Symmetric strength in extremities for me.  Some dysarthria.  Pupils symmetric and reactive.  Possible mild neglect on the right side.  Psychiatric:        Mood and Affect: Mood normal.     ED Results / Procedures / Treatments   Labs (all labs ordered are listed, but only abnormal results are displayed) Labs Reviewed  APTT - Abnormal; Notable for the following components:      Result Value   aPTT 23 (*)    All other components within normal limits  COMPREHENSIVE METABOLIC PANEL - Abnormal; Notable for the following components:   CO2 21 (*)    Glucose, Bld 145 (*)    All other components within normal limits  I-STAT CHEM 8, ED - Abnormal; Notable for the following components:   Glucose, Bld 150 (*)    Calcium, Ion 1.05 (*)    All other components within normal limits  RESPIRATORY PANEL BY RT PCR (FLU A&B, COVID)  ETHANOL  PROTIME-INR  CBC  DIFFERENTIAL  RAPID URINE DRUG SCREEN, HOSP PERFORMED  URINALYSIS, ROUTINE W REFLEX MICROSCOPIC    EKG EKG Interpretation  Date/Time:  Monday May 05 2020 11:05:44 EDT Ventricular Rate:  93 PR Interval:    QRS Duration: 96 QT Interval:  377 QTC Calculation: 469 R Axis:   -39 Text Interpretation: Sinus rhythm Consider right atrial enlargement Left axis deviation  Abnormal R-wave progression, early transition NO prior ECG for comparison. No sTEMI Confirmed by Antony Blackbird [phone]) on 05/05/2020 11:07:28 AM   Radiology CT HEAD CODE STROKE WO CONTRAST  Result Date: 05/05/2020 CLINICAL DATA:  Code stroke.  Left-sided weakness slurred speech EXAM: CT HEAD WITHOUT CONTRAST TECHNIQUE: Contiguous axial images were obtained from the base of the skull through the vertex without intravenous contrast. COMPARISON:  None. FINDINGS: Brain: No evidence of acute infarction, hemorrhage, hydrocephalus, extra-axial collection or mass lesion/mass effect. Vascular: Negative for hyperdense vessel. Atherosclerotic calcification distal vertebral arteries. Skull: Negative Sinuses/Orbits: Paranasal sinuses clear.  Negative orbit Other: None ASPECTS ([place] Stroke Program Early CT Score) - Ganglionic level infarction (caudate, lentiform nuclei, internal capsule,  insula, M1-M3 cortex): 7 - Supraganglionic infarction (M4-M6 cortex): 3 Total score (0-10 with 10 being normal): 10 IMPRESSION: 1. No acute intracranial abnormality. 2. ASPECTS is 10 3. Code stroke imaging results were communicated on 05/05/2020 at 10:55 am to provider Lorrin Goodell via [facility] page Electronically Signed   By: Franchot Gallo M.D.   On: 05/05/2020 10:55   CT ANGIO HEAD CODE STROKE  Result Date: 05/05/2020 CLINICAL DATA:  Left-sided weakness.  Slurred speech. EXAM: CT ANGIOGRAPHY HEAD AND NECK TECHNIQUE: Multidetector CT imaging of the head and neck was performed using the standard protocol during bolus administration of intravenous contrast. Multiplanar CT image reconstructions and MIPs were obtained to evaluate the vascular anatomy. Carotid stenosis measurements (when applicable) are obtained utilizing NASCET criteria, using the distal internal carotid diameter as the denominator. CONTRAST:  6mL OMNIPAQUE IOHEXOL 300 MG/ML  SOLN COMPARISON:  Head CT earlier same day. FINDINGS: CTA NECK FINDINGS Aortic arch: No  atherosclerotic change. No aneurysm or dissection. Branching pattern is normal. Right carotid system: Common carotid artery widely patent to the bifurcation. Carotid bifurcation is widely patent. Cervical ICA is normal. Minimal calcified plaque in the proximal ECA. Left carotid system: Common carotid artery widely patent to the bifurcation. Carotid bifurcation is normal. Cervical ICA is normal. Vertebral arteries: Both vertebral arteries widely patent at their origins and through the cervical region to the foramen magnum. Skeleton: Minimal cervical spondylosis. Other neck: No mass or lymphadenopathy. Upper chest: Normal Review of the MIP images confirms the above findings CTA HEAD FINDINGS Anterior circulation: Both internal carotid arteries widely patent through the skull base and siphon regions. No siphon stenosis. The anterior and middle cerebral vessels are patent without proximal stenosis, aneurysm or vascular malformation. No large or medium vessel occlusion identified. Posterior circulation: Both vertebral arteries widely patent to the basilar. There is some atherosclerotic disease in both vertebral V4 segments but no stenosis greater than 30%. No basilar stenosis. Posterior circulation branch vessels are normal. Venous sinuses: Patent and normal. Anatomic variants: None significant. Review of the MIP images confirms the above findings IMPRESSION: 1. No large or medium vessel occlusion. 2. No significant carotid bifurcation disease. 3. Mild atherosclerotic disease in both vertebral V4 segments but without stenosis greater than 30%. Electronically Signed   By: Nelson Chimes M.D.   On: 05/05/2020 11:09   CT ANGIO NECK CODE STROKE  Result Date: 05/05/2020 CLINICAL DATA:  Left-sided weakness.  Slurred speech. EXAM: CT ANGIOGRAPHY HEAD AND NECK TECHNIQUE: Multidetector CT imaging of the head and neck was performed using the standard protocol during bolus administration of intravenous contrast. Multiplanar CT  image reconstructions and MIPs were obtained to evaluate the vascular anatomy. Carotid stenosis measurements (when applicable) are obtained utilizing NASCET criteria, using the distal internal carotid diameter as the denominator. CONTRAST:  23mL OMNIPAQUE IOHEXOL 300 MG/ML  SOLN COMPARISON:  Head CT earlier same day. FINDINGS: CTA NECK FINDINGS Aortic arch: No atherosclerotic change. No aneurysm or dissection. Branching pattern is normal. Right carotid system: Common carotid artery widely patent to the bifurcation. Carotid bifurcation is widely patent. Cervical ICA is normal. Minimal calcified plaque in the proximal ECA. Left carotid system: Common carotid artery widely patent to the bifurcation. Carotid bifurcation is normal. Cervical ICA is normal. Vertebral arteries: Both vertebral arteries widely patent at their origins and through the cervical region to the foramen magnum. Skeleton: Minimal cervical spondylosis. Other neck: No mass or lymphadenopathy. Upper chest: Normal Review of the MIP images confirms the above findings CTA HEAD FINDINGS  Anterior circulation: Both internal carotid arteries widely patent through the skull base and siphon regions. No siphon stenosis. The anterior and middle cerebral vessels are patent without proximal stenosis, aneurysm or vascular malformation. No large or medium vessel occlusion identified. Posterior circulation: Both vertebral arteries widely patent to the basilar. There is some atherosclerotic disease in both vertebral V4 segments but no stenosis greater than 30%. No basilar stenosis. Posterior circulation branch vessels are normal. Venous sinuses: Patent and normal. Anatomic variants: None significant. Review of the MIP images confirms the above findings IMPRESSION: 1. No large or medium vessel occlusion. 2. No significant carotid bifurcation disease. 3. Mild atherosclerotic disease in both vertebral V4 segments but without stenosis greater than 30%. Electronically Signed    By: Nelson Chimes M.D.   On: 05/05/2020 11:09    Procedures Procedures (including critical care time)  CRITICAL CARE Performed by: Gwenyth Allegra Scotlynn Noyes Total critical care time: 35 minutes Critical care time was exclusive of separately billable procedures and treating other patients. Code Stroke received TPA Critical care was necessary to treat or prevent imminent or life-threatening deterioration. Critical care was time spent personally by me on the following activities: development of treatment plan with patient and/or surrogate as well as nursing, discussions with consultants, evaluation of patient's response to treatment, examination of patient, obtaining history from patient or surrogate, ordering and performing treatments and interventions, ordering and review of laboratory studies, ordering and review of radiographic studies, pulse oximetry and re-evaluation of patient's condition.   Medications Ordered in ED Medications   stroke: mapping our early stages of recovery book (has no administration in time range)  0.9 %  sodium chloride infusion ( Intravenous New Bag/Given 05/05/20 1243)  acetaminophen (TYLENOL) tablet 650 mg (has no administration in time range)    Or  acetaminophen (TYLENOL) 160 MG/5ML solution 650 mg (has no administration in time range)    Or  acetaminophen (TYLENOL) suppository 650 mg (has no administration in time range)  senna-docusate (Senokot-S) tablet 1 tablet (has no administration in time range)  pantoprazole (PROTONIX) injection 40 mg (has no administration in time range)  labetalol (NORMODYNE) injection 20 mg (20 mg Intravenous Not Given 05/05/20 1144)    And  clevidipine (CLEVIPREX) infusion 0.5 mg/mL (0 mg/hr Intravenous Not Given 05/05/20 1145)  Chlorhexidine Gluconate Cloth 2 % PADS 6 each (has no administration in time range)  alteplase (ACTIVASE) 1 mg/mL infusion 73 mg (0 mg Intravenous Stopped 05/05/20 1152)    Followed by  0.9 %  sodium  chloride infusion (50 mLs Intravenous New Bag/Given 05/05/20 1152)  iohexol (OMNIPAQUE) 300 MG/ML solution 60 mL (60 mLs Intravenous Contrast Given 05/05/20 1103)    ED Course  I have reviewed the triage vital signs and the nursing notes.  Pertinent labs & imaging results that were available during my care of the patient were reviewed by me and considered in my medical decision making (see chart for details).    MDM Rules/Calculators/A&P                          Onyx Schirmer is a 69 y.o. male with a past medical history significant for prior inguinal hernia repair, melanoma skin cancer, and arthritis who presents as a code stroke.  According to EMS, patient symptoms began at 9:30 AM, approximately 1 hour prior to arrival.  Patient was found to have difficulty speaking and left-sided weakness.  I assessed the patient as he entered the emergency department  and clear the airway.  He is retracting his airway and then told me his name.  Patient taken to CT scanner where had imaging.  Neurology also assessed the patient upon arrival and in the [facility].  Patient taken to trauma CT after evaluation.  On my exam, lungs are clear and chest is nontender.  Abdomen is nontender.  Patient does have some subtle left-sided facial droop and some dysarthria.  I did not appreciate significant grip strength decreased or leg strength decreased.  Patient is denying headache.  Patient was given TPA and will be mated by neurology to the ICU.  Family arrived and I had a discussion with them.  She agrees that he was at his baseline till 930 and this morning when he started having the unsteadiness, speech difficulty, facial droop, and left-sided weakness.  On my reassessment, he is speaking somewhat more clearly than before.  I still do not appreciate any weakness in his left arm and left leg.  Patient will be admitted to the ICU for further management of his stroke.   Final Clinical Impression(s) / ED  Diagnoses Final diagnoses:  Cerebrovascular accident (CVA), unspecified mechanism ([place])    Clinical Impression: 1. Cerebrovascular accident (CVA), unspecified mechanism ([place])     Disposition: Admit  This note was prepared with assistance of Dragon voice recognition software. Occasional wrong-word or sound-a-like substitutions may have occurred due to the inherent limitations of voice recognition software.     Heraclio Seidman, Gwenyth Allegra, MD 05/05/20 1248

## 2020-05-05 NOTE — Code Documentation (Signed)
Stroke Response Nurse Documentation Code Documentation  Lawyer Washabaugh is a 69 y.o. male arriving to [facility] ED via [facility] on 05/05/2020 with past medical hx of arthritis. Code stroke was activated by EMS. Patient from home where he was LKW at 0930 and now complaining of slurred speech, facial droop, and confusion.   Stroke team at the bedside on patient arrival. Labs drawn and patient cleared for CT by Dr. Sherry Ruffing. Patient to CT with team. NIHSS 6, see documentation for details and code stroke times. Patient with left facial droop, bilateral limb ataxia, Expressive aphasia  and dysarthria on exam. The following imaging was completed: CT and CTA head and neck. Patient is a candidate for tPA. Care/Plan: Follow Post-tPA protocol - q15 VS/mNIHSS per two hours, FULL NIHSS at 1252. Q30 for 6 hours, q1 for 16 hours. Bedside handoff with ED RN Claiborne Billings.    Kathrin Greathouse  Stroke Response RN

## 2020-05-05 NOTE — Progress Notes (Signed)
  Echocardiogram 2D Echocardiogram has been performed.  Samuel Lee 05/05/2020, 1:54 PM

## 2020-05-05 NOTE — Progress Notes (Signed)
Pharmacist Code Stroke Response  Notified to mix tPA at 1047 by Dr. Lorrin Goodell Delivered tPA to RN at 1051 tPA administered at 1052  tPA dose = 7.3mg  bolus over 1 minute followed by 65.7mg  for a total dose of 73mg  over 1 hour  Issues/delays encountered (if applicable): None  Albertina Parr, PharmD., BCPS, BCCCP Clinical Pharmacist Please refer to [facility] for unit-specific pharmacist

## 2020-05-05 NOTE — Plan of Care (Signed)
  Problem: Coping: Goal: Will verbalize positive feelings about self Outcome: Progressing   Problem: Self-Care: Goal: Ability to communicate needs accurately will improve Outcome: Progressing   Problem: Ischemic Stroke/TIA Tissue Perfusion: Goal: Complications of ischemic stroke/TIA will be minimized Outcome: Progressing

## 2020-05-06 ENCOUNTER — Inpatient Hospital Stay (HOSPITAL_COMMUNITY): Payer: PPO

## 2020-05-06 DIAGNOSIS — I6300 Cerebral infarction due to thrombosis of unspecified precerebral artery: Secondary | ICD-10-CM

## 2020-05-06 LAB — LIPID PANEL
Cholesterol: 180 mg/dL (ref 0–200)
HDL: 69 mg/dL (ref >40)
LDL Cholesterol: 102 mg/dL — ABNORMAL HIGH (ref 0–99)
Total CHOL/HDL Ratio: 2.6 RATIO
Triglycerides: 47 mg/dL (ref <150)
VLDL: 9 mg/dL (ref 0–40)

## 2020-05-06 LAB — GLUCOSE, CAPILLARY: Glucose-Capillary: 145 mg/dL — ABNORMAL HIGH (ref 70–99)

## 2020-05-06 LAB — HEMOGLOBIN A1C
Hgb A1c MFr Bld: 4.6 % — ABNORMAL LOW (ref 4.8–5.6)
Mean Plasma Glucose: 85.32 mg/dL

## 2020-05-06 MED ORDER — ADULT MULTIVITAMIN W/MINERALS CH
1.0000 | ORAL_TABLET | Freq: Every day | ORAL | Status: DC
  Administered 2020-05-06 – 2020-05-11 (×6): 1 via ORAL
  Filled 2020-05-06 (×6): qty 1

## 2020-05-06 MED ORDER — LORAZEPAM 2 MG/ML IJ SOLN
1.0000 mg | INTRAMUSCULAR | Status: AC | PRN
  Administered 2020-05-06 (×2): 2 mg via INTRAVENOUS
  Administered 2020-05-07: 1 mg via INTRAVENOUS
  Filled 2020-05-06 (×4): qty 1

## 2020-05-06 MED ORDER — THIAMINE HCL 100 MG PO TABS
100.0000 mg | ORAL_TABLET | Freq: Every day | ORAL | Status: DC
  Administered 2020-05-06 – 2020-05-11 (×5): 100 mg via ORAL
  Filled 2020-05-06 (×6): qty 1

## 2020-05-06 MED ORDER — FOLIC ACID 1 MG PO TABS
1.0000 mg | ORAL_TABLET | Freq: Every day | ORAL | Status: DC
  Administered 2020-05-06 – 2020-05-11 (×6): 1 mg via ORAL
  Filled 2020-05-06 (×6): qty 1

## 2020-05-06 MED ORDER — PNEUMOCOCCAL VAC POLYVALENT 25 MCG/0.5ML IJ INJ
0.5000 mL | INJECTION | INTRAMUSCULAR | Status: AC
  Administered 2020-05-07: 0.5 mL via INTRAMUSCULAR
  Filled 2020-05-06: qty 0.5

## 2020-05-06 MED ORDER — ONDANSETRON HCL 4 MG/2ML IJ SOLN
4.0000 mg | Freq: Four times a day (QID) | INTRAMUSCULAR | Status: DC | PRN
  Administered 2020-05-06: 4 mg via INTRAVENOUS
  Filled 2020-05-06: qty 2

## 2020-05-06 MED ORDER — THIAMINE HCL 100 MG/ML IJ SOLN
100.0000 mg | Freq: Every day | INTRAMUSCULAR | Status: DC
  Administered 2020-05-07: 100 mg via INTRAVENOUS
  Filled 2020-05-06: qty 2

## 2020-05-06 MED ORDER — LORAZEPAM 1 MG PO TABS: 1.0000 mg | ORAL_TABLET | ORAL | Status: AC | PRN

## 2020-05-06 MED ORDER — ASPIRIN EC 81 MG PO TBEC
81.0000 mg | DELAYED_RELEASE_TABLET | Freq: Every day | ORAL | Status: DC
  Administered 2020-05-06 – 2020-05-09 (×4): 81 mg via ORAL
  Filled 2020-05-06 (×4): qty 1

## 2020-05-06 MED ORDER — CLOPIDOGREL BISULFATE 75 MG PO TABS
75.0000 mg | ORAL_TABLET | Freq: Every day | ORAL | Status: DC
  Administered 2020-05-06 – 2020-05-09 (×4): 75 mg via ORAL
  Filled 2020-05-06 (×4): qty 1

## 2020-05-06 NOTE — Progress Notes (Signed)
Patient and wife arrived to 3w-10. Belongings are with patient, wife will take them home

## 2020-05-06 NOTE — Progress Notes (Addendum)
STROKE TEAM PROGRESS NOTE   INTERVAL HISTORY His wife is at the bedside. Patient stable post tPA, now off cleviprex. I personally reviewed history of presenting illness, electronic medical records and imaging films in PACS. Patient remained neurologically stable overnight. Blood pressure adequately controlled. MRI scan shows no acute infarct but is limited secondary to patient becoming nauseous but DWI is negative for acute stroke History obtained by me from patient's wife is that he wakes up every morning before his wife a couple of hours and goes for a walk and comes back. And wife woke up she noticed that he had not gone for his walk and was acting different and had trouble speaking and slurred speech and slightly confused. EMS noticed some facial droop on the left. He has no known prior history of seizures strokes TIAs or significant head injury with loss of consciousness. Patient is participating in [place] stroke study for TPA patients Vitals:   05/06/20 0700 05/06/20 0800 05/06/20 0815 05/06/20 0830  BP: 125/72 129/70 133/80 (!) 146/91  Pulse: 64 89 81 81  Resp: 17 (!) 29 20 (!) 22  Temp:  98.4 F (36.9 C)    TempSrc:  Oral    SpO2: 97% 95% 98% 97%  Weight:      Height:       CBC:  Recent Labs  Lab 05/05/20 1036 05/05/20 1047  WBC 7.6  --   NEUTROABS 6.2  --   HGB 16.3 15.3  HCT 47.7 45.0  MCV 93.0  --   PLT 154  --    Basic Metabolic Panel:  Recent Labs  Lab 05/05/20 1036 05/05/20 1047  NA 139 140  K 3.7 3.7  CL 105 106  CO2 21*  --   GLUCOSE 145* 150*  BUN 17 20  CREATININE 0.87 0.70  CALCIUM 9.1  --    Lipid Panel:  Recent Labs  Lab 05/06/20 0417  CHOL 180  TRIG 47  HDL 69  CHOLHDL 2.6  VLDL 9  LDLCALC 102*   HgbA1c:  Recent Labs  Lab 05/06/20 0417  HGBA1C 4.6*   Urine Drug Screen:  Recent Labs  Lab 05/05/20 1248  LABOPIA NONE DETECTED  COCAINSCRNUR NONE DETECTED  LABBENZ NONE DETECTED  AMPHETMU NONE DETECTED  THCU NONE DETECTED   LABBARB NONE DETECTED    Alcohol Level  Recent Labs  Lab 05/05/20 1036  ETH <10    IMAGING past 24 hours ECHOCARDIOGRAM COMPLETE  Result Date: 05/05/2020    ECHOCARDIOGRAM REPORT   Patient Name:   AVANISH [facility] Date of Exam: 05/05/2020 Medical Rec #:  [id number]        Height:       69.0 in Accession #:    [id number]       Weight:       182.5 lb Date of Birth:  1950-08-26         BSA:          1.987 m Patient Age:    69 years         BP:           157/94 mmHg Patient Gender: M                HR:           89 bpm. Exam Location:  Inpatient Procedure: 2D Echo, Cardiac Doppler and Color Doppler Indications:    Stroke 434.91 / I163.9  History:        Patient has  no prior history of Echocardiogram examinations.  Sonographer:    Bernadene Person RDCS Referring Phys: [place]  1. Left ventricular ejection fraction, by estimation, is 45 to 50%. The left ventricle has mildly decreased function. The left ventricle demonstrates regional wall motion abnormalities (see scoring diagram/findings for description). Left ventricular diastolic parameters were normal. There is mild hypokinesis of the left ventricular, mid-apical inferolateral wall.  2. Right ventricular systolic function is normal. The right ventricular size is normal.  3. The mitral valve is normal in structure. Trivial mitral valve regurgitation. No evidence of mitral stenosis.  4. The aortic valve is grossly normal. Aortic valve regurgitation is mild. No aortic stenosis is present. Comparison(s): No prior Echocardiogram. Conclusion(s)/Recommendation(s): No intracardiac source of embolism detected on this transthoracic study. A transesophageal echocardiogram is recommended to exclude cardiac source of embolism if clinically indicated. Mildly reduced LVEF. Inferolateral wall incompletely visualized, but in both parasternal short axis and apical images, appears to be hypokinetic. No prior for comparison. FINDINGS  Left Ventricle:  Left ventricular ejection fraction, by estimation, is 45 to 50%. The left ventricle has mildly decreased function. The left ventricle demonstrates regional wall motion abnormalities. Mild hypokinesis of the left ventricular, mid-apical inferolateral wall. The left ventricular internal cavity size was normal in size. There is no left ventricular hypertrophy. Left ventricular diastolic parameters were normal. Right Ventricle: The right ventricular size is normal. No increase in right ventricular wall thickness. Right ventricular systolic function is normal. Left Atrium: Left atrial size was normal in size. Right Atrium: Right atrial size was normal in size. Pericardium: There is no evidence of pericardial effusion. Mitral Valve: The mitral valve is normal in structure. Trivial mitral valve regurgitation. No evidence of mitral valve stenosis. Tricuspid Valve: The tricuspid valve is normal in structure. Tricuspid valve regurgitation is trivial. No evidence of tricuspid stenosis. Aortic Valve: The aortic valve is grossly normal. Aortic valve regurgitation is mild. Aortic regurgitation PHT measures 572 msec. No aortic stenosis is present. Pulmonic Valve: The pulmonic valve was not well visualized. Pulmonic valve regurgitation is not visualized. Aorta: The aortic root, ascending aorta and aortic arch are all structurally normal, with no evidence of dilitation or obstruction. IAS/Shunts: The interatrial septum was not well visualized.  LEFT VENTRICLE PLAX 2D LVIDd:         5.60 cm      Diastology LVIDs:         3.80 cm      LV e' medial:    4.90 cm/s LV PW:         0.80 cm      LV E/e' medial:  12.7 LV IVS:        0.80 cm      LV e' lateral:   7.51 cm/s LVOT diam:     2.10 cm      LV E/e' lateral: 8.3 LV SV:         76 LV SV Index:   38 LVOT Area:     3.46 cm  LV Volumes (MOD) LV vol d, MOD A2C: 101.0 ml LV vol d, MOD A4C: 113.0 ml LV vol s, MOD A2C: 51.5 ml LV vol s, MOD A4C: 64.8 ml LV SV MOD A2C:     49.5 ml LV SV MOD  A4C:     113.0 ml LV SV MOD BP:      45.4 ml RIGHT VENTRICLE RV S prime:     15.30 cm/s TAPSE (M-mode): 2.2 cm LEFT ATRIUM  Index       RIGHT ATRIUM           Index LA diam:        3.20 cm 1.61 cm/m  RA Area:     10.60 cm LA Vol (A2C):   40.4 ml 20.33 ml/m RA Volume:   19.10 ml  9.61 ml/m LA Vol (A4C):   31.5 ml 15.85 ml/m LA Biplane Vol: 38.2 ml 19.22 ml/m  AORTIC VALVE LVOT Vmax:   102.00 cm/s LVOT Vmean:  71.300 cm/s LVOT VTI:    0.220 m AI PHT:      572 msec  AORTA Ao Root diam: 3.70 cm Ao Asc diam:  3.60 cm MITRAL VALVE MV Area (PHT): 2.50 cm    SHUNTS MV Decel Time: 303 msec    Systemic VTI:  0.22 m MV E velocity: 62.10 cm/s  Systemic Diam: 2.10 cm MV A velocity: 97.70 cm/s MV E/A ratio:  0.64 Buford Dresser MD Electronically signed by Buford Dresser MD Signature Date/Time: 05/05/2020/7:21:25 PM    Final    CT HEAD CODE STROKE WO CONTRAST  Result Date: 05/05/2020 CLINICAL DATA:  Code stroke.  Left-sided weakness slurred speech EXAM: CT HEAD WITHOUT CONTRAST TECHNIQUE: Contiguous axial images were obtained from the base of the skull through the vertex without intravenous contrast. COMPARISON:  None. FINDINGS: Brain: No evidence of acute infarction, hemorrhage, hydrocephalus, extra-axial collection or mass lesion/mass effect. Vascular: Negative for hyperdense vessel. Atherosclerotic calcification distal vertebral arteries. Skull: Negative Sinuses/Orbits: Paranasal sinuses clear.  Negative orbit Other: None ASPECTS ([place] Stroke Program Early CT Score) - Ganglionic level infarction (caudate, lentiform nuclei, internal capsule, insula, M1-M3 cortex): 7 - Supraganglionic infarction (M4-M6 cortex): 3 Total score (0-10 with 10 being normal): 10 IMPRESSION: 1. No acute intracranial abnormality. 2. ASPECTS is 10 3. Code stroke imaging results were communicated on 05/05/2020 at 10:55 am to provider Lorrin Goodell via [facility] page Electronically Signed   By: Franchot Gallo M.D.   On:  05/05/2020 10:55   CT ANGIO HEAD CODE STROKE  Result Date: 05/05/2020 CLINICAL DATA:  Left-sided weakness.  Slurred speech. EXAM: CT ANGIOGRAPHY HEAD AND NECK TECHNIQUE: Multidetector CT imaging of the head and neck was performed using the standard protocol during bolus administration of intravenous contrast. Multiplanar CT image reconstructions and MIPs were obtained to evaluate the vascular anatomy. Carotid stenosis measurements (when applicable) are obtained utilizing NASCET criteria, using the distal internal carotid diameter as the denominator. CONTRAST:  48mL OMNIPAQUE IOHEXOL 300 MG/ML  SOLN COMPARISON:  Head CT earlier same day. FINDINGS: CTA NECK FINDINGS Aortic arch: No atherosclerotic change. No aneurysm or dissection. Branching pattern is normal. Right carotid system: Common carotid artery widely patent to the bifurcation. Carotid bifurcation is widely patent. Cervical ICA is normal. Minimal calcified plaque in the proximal ECA. Left carotid system: Common carotid artery widely patent to the bifurcation. Carotid bifurcation is normal. Cervical ICA is normal. Vertebral arteries: Both vertebral arteries widely patent at their origins and through the cervical region to the foramen magnum. Skeleton: Minimal cervical spondylosis. Other neck: No mass or lymphadenopathy. Upper chest: Normal Review of the MIP images confirms the above findings CTA HEAD FINDINGS Anterior circulation: Both internal carotid arteries widely patent through the skull base and siphon regions. No siphon stenosis. The anterior and middle cerebral vessels are patent without proximal stenosis, aneurysm or vascular malformation. No large or medium vessel occlusion identified. Posterior circulation: Both vertebral arteries widely patent to the basilar. There is some atherosclerotic disease in both  vertebral V4 segments but no stenosis greater than 30%. No basilar stenosis. Posterior circulation branch vessels are normal. Venous sinuses:  Patent and normal. Anatomic variants: None significant. Review of the MIP images confirms the above findings IMPRESSION: 1. No large or medium vessel occlusion. 2. No significant carotid bifurcation disease. 3. Mild atherosclerotic disease in both vertebral V4 segments but without stenosis greater than 30%. Electronically Signed   By: Nelson Chimes M.D.   On: 05/05/2020 11:09   CT ANGIO NECK CODE STROKE  Result Date: 05/05/2020 CLINICAL DATA:  Left-sided weakness.  Slurred speech. EXAM: CT ANGIOGRAPHY HEAD AND NECK TECHNIQUE: Multidetector CT imaging of the head and neck was performed using the standard protocol during bolus administration of intravenous contrast. Multiplanar CT image reconstructions and MIPs were obtained to evaluate the vascular anatomy. Carotid stenosis measurements (when applicable) are obtained utilizing NASCET criteria, using the distal internal carotid diameter as the denominator. CONTRAST:  31mL OMNIPAQUE IOHEXOL 300 MG/ML  SOLN COMPARISON:  Head CT earlier same day. FINDINGS: CTA NECK FINDINGS Aortic arch: No atherosclerotic change. No aneurysm or dissection. Branching pattern is normal. Right carotid system: Common carotid artery widely patent to the bifurcation. Carotid bifurcation is widely patent. Cervical ICA is normal. Minimal calcified plaque in the proximal ECA. Left carotid system: Common carotid artery widely patent to the bifurcation. Carotid bifurcation is normal. Cervical ICA is normal. Vertebral arteries: Both vertebral arteries widely patent at their origins and through the cervical region to the foramen magnum. Skeleton: Minimal cervical spondylosis. Other neck: No mass or lymphadenopathy. Upper chest: Normal Review of the MIP images confirms the above findings CTA HEAD FINDINGS Anterior circulation: Both internal carotid arteries widely patent through the skull base and siphon regions. No siphon stenosis. The anterior and middle cerebral vessels are patent without  proximal stenosis, aneurysm or vascular malformation. No large or medium vessel occlusion identified. Posterior circulation: Both vertebral arteries widely patent to the basilar. There is some atherosclerotic disease in both vertebral V4 segments but no stenosis greater than 30%. No basilar stenosis. Posterior circulation branch vessels are normal. Venous sinuses: Patent and normal. Anatomic variants: None significant. Review of the MIP images confirms the above findings IMPRESSION: 1. No large or medium vessel occlusion. 2. No significant carotid bifurcation disease. 3. Mild atherosclerotic disease in both vertebral V4 segments but without stenosis greater than 30%. Electronically Signed   By: Nelson Chimes M.D.   On: 05/05/2020 11:09    PHYSICAL EXAM Pleasant elderly Caucasian male not in distress. . Afebrile. Head is nontraumatic. Neck is supple without bruit.    Cardiac exam no murmur or gallop. Lungs are clear to auscultation. Distal pulses are well felt. Neurological Exam ;  Awake  Alert oriented x 2. Diminished attention registration and recall. Easily distractible..Moderately dysarthric speech and l follows one and two-step commands. Eye movements full without nystagmus.fundi were not visualized. Vision acuity and fields appear normal. Hearing is normal. Palatal movements are normal. Face symmetric with only subtle left nasolabial fold asymmetry when he smiles.. Tongue midline. Normal strength, tone, reflexes and coordination. Normal sensation. Gait deferred.  ASSESSMENT/PLAN [name] with history of melanoma and arthritis presenting with L sided weakness, aphasia and slurred speech. Received tPA 05/05/2020 at 1052.  Stroke-like symptoms s/p tPA, TIA vs Seizures and postictal confusion  Code Stroke CT head No acute abnormality. ASPECTS 10.     CTA head & neck no LVO. Mild atherosclerosis B V4 <30%  MRI  No acute infarct  2D Echo EF 45-50%. No source  of embolus   EEG continuous slowing, generalizaed  LDL 102  HgbA1c 4.6  VTE prophylaxis - SCDs on  No antithrombotic prior to admission, now on No antithrombotic. Added DAPT x 3 weeks then aspirin alone    Therapy recommendations:  pending (he routinely walks 10 miles/day, [place] 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 Treated w/ cleviprex, now off . Long-term BP goal normotensive  Hyperlipidemia  Home meds:  No statin  LDL 102, goal < 70  Consider statin   Dysphagia . Secondary to stroke . NPO . 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 Mobilize out of bed. Therapy consults. Check EEG for seizure activity. Continue ongoing stroke work-up. Alcohol withdrawal precautions. Long discussion patient and wife and answered questions..Patient is participating in [place] stroke study for TPA patients This patient is critically ill and at significant risk of neurological worsening, death and care requires constant monitoring of vital signs, hemodynamics,respiratory and cardiac monitoring, extensive review of multiple databases, frequent neurological assessment, discussion with family, other specialists and medical decision making of high complexity.I have made any additions or clarifications directly to the above note.This critical care time does not reflect procedure time, or teaching time or  supervisory time of PA/NP/Med Resident etc but could involve care discussion time.  I spent 30 minutes of neurocritical care time  in the care of  this patient.    Antony Contras, MD  To contact Stroke Continuity provider, please refer to [email or web address] After hours, contact General Neurology

## 2020-05-06 NOTE — Evaluation (Signed)
Clinical/Bedside Swallow Evaluation Patient Details  Name: Samuel Lee MRN: [id number] Date of Birth: 1951/03/03  Today's Date: 05/06/2020 Time: SLP Start Time (ACUTE ONLY): 1334 SLP Stop Time (ACUTE ONLY): 1345 SLP Time Calculation (min) (ACUTE ONLY): 11 min  Past Medical History:  Past Medical History:  Diagnosis Date  . Arthritis   . Cancer ([place])    melanoma   Past Surgical History:  Past Surgical History:  Procedure Laterality Date  . HIP SURGERY  03/2010   bilaterally hip  . INGUINAL HERNIA REPAIR Right 04/09/2015   Procedure: RIGHT INGUINAL HERNIA REPAIR WITH MESH;  Surgeon: Erroll Luna, MD;  Location: [facility];  Service: General;  Laterality: Right;  . INSERTION OF MESH Right 04/09/2015   Procedure: INSERTION OF MESH;  Surgeon: Erroll Luna, MD;  Location: [facility];  Service: General;  Laterality: Right;  . MELANOMA EXCISION  09/2010   nose   HPI:  Pt is a 69 yo male presenting with sudden onset L sided weakness, slurred speech, and aphasia s/p tPA. CTH and MRI negative. PMH includes: melanoma, arthritis   Assessment / Plan / Recommendation Clinical Impression  Pt has mild L facial weakness but can achieve adequate labial seal. He lost boluses bilaterally out of his oral cavity while getting puree off of a spoon but this appeared to be more cognitively based, and made attempts on his own to try to manage this spillage. There is also mild, diffuse residue throughout his oral cavity that he can clear well once he is made aware of it. Immediate coughing was noted fairly consistently with straw sips of thin liquids, with coughing eliminated when drinking via cup regardless of rate and volume. Pt did also pass a [place] swallow screen earlier today. Recommend starting Dys 3 diet in light of mentation and thin liquids via cup (no straw). Will f/u for tolerance and readiness to advance.    SLP Visit Diagnosis: Dysphagia, unspecified (R13.10)     Aspiration Risk  Mild aspiration risk;Moderate aspiration risk    Diet Recommendation Dysphagia 3 (Mech soft);Thin liquid   Liquid Administration via: Cup;No straw Medication Administration: Whole meds with puree Supervision: Staff to assist with self feeding;Full supervision/cueing for compensatory strategies Compensations: Minimize environmental distractions;Slow rate;Small sips/bites Postural Changes: Seated upright at 90 degrees    Other  Recommendations Oral Care Recommendations: Oral care BID   Follow up Recommendations  (tba)      Frequency and Duration min 2x/week  2 weeks       Prognosis Prognosis for Safe Diet Advancement: Good Barriers to Reach Goals: Cognitive deficits      Swallow Study   General HPI: Pt is a 69 yo male presenting with sudden onset L sided weakness, slurred speech, and aphasia s/p tPA. CTH and MRI negative. PMH includes: melanoma, arthritis Type of Study: Bedside Swallow Evaluation Previous Swallow Assessment: none in chart Diet Prior to this Study: NPO Temperature Spikes Noted: No Respiratory Status: Room air History of Recent Intubation: No Behavior/Cognition: Alert;Cooperative;Confused;Requires cueing Oral Cavity Assessment: Within Functional Limits Oral Care Completed by SLP: No Oral Cavity - Dentition: Adequate natural dentition Vision: Functional for self-feeding Self-Feeding Abilities: Needs assist Patient Positioning: Upright in bed Baseline Vocal Quality: Normal Volitional Cough: Strong Volitional Swallow: Able to elicit    Oral/Motor/Sensory Function Overall Oral Motor/Sensory Function: Mild impairment Facial ROM: Reduced left;Suspected CN VII (facial) dysfunction Facial Symmetry: Abnormal symmetry left;Suspected CN VII (facial) dysfunction Facial Strength: Reduced left;Suspected CN VII (facial) dysfunction Lingual ROM:  Within Functional Limits Lingual Symmetry: Within Functional Limits Lingual Strength: Within Functional  Limits Velum: Within Functional Limits Mandible: Within Functional Limits   Ice Chips Ice chips: Within functional limits Presentation: Spoon   Thin Liquid Thin Liquid: Impaired Presentation: Cup;Self Fed;Straw Pharyngeal  Phase Impairments: Cough - Immediate    Nectar Thick Nectar Thick Liquid: Not tested   Honey Thick Honey Thick Liquid: Not tested   Puree Puree: Impaired Presentation: Spoon;Self Fed Oral Phase Functional Implications: Right anterior spillage;Left anterior spillage   Solid     Solid: Impaired Presentation: Self Fed Oral Phase Functional Implications: Oral residue      Osie Bond., M.A. [facility] Pager [phone] Office [phone]  05/06/2020,3:37 PM

## 2020-05-06 NOTE — Progress Notes (Signed)
PT/OT at bedside. Ativan held. Will reassess CIWA after therapy.

## 2020-05-06 NOTE — Evaluation (Signed)
Speech Language Pathology Evaluation Patient Details Name: Samuel Lee MRN: [id number] DOB: January 17, 1951 Today's Date: 05/06/2020 Time: 0932-6712 SLP Time Calculation (min) (ACUTE ONLY): 12 min  Problem List:  Patient Active Problem List   Diagnosis Date Noted  . 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 Arthritis   . Cancer ([place])    melanoma   Past Surgical History:  Past Surgical History:  Procedure Laterality Date  . HIP SURGERY  03/2010   bilaterally hip  . INGUINAL HERNIA REPAIR Right 04/09/2015   Procedure: RIGHT INGUINAL HERNIA REPAIR WITH MESH;  Surgeon: Erroll Luna, MD;  Location: [place];  Service: General;  Laterality: Right;  . INSERTION OF MESH Right 04/09/2015   Procedure: INSERTION OF MESH;  Surgeon: Erroll Luna, MD;  Location: [facility];  Service: General;  Laterality: Right;  . MELANOMA EXCISION  09/2010   nose   HPI:  Samuel Lee is a 69 yo male presenting with sudden onset L sided weakness, slurred speech, and aphasia s/p tPA. CTH and MRI negative. PMH includes: melanoma, arthritis   Assessment / Plan / Recommendation Clinical Impression  Samuel Lee has impaired comprehension for simple commands, often not understanding instructions for tasks throughout cognitive evaluation. Samuel Lee could repeat at the word and phrase level, but not at the sentence level. His expressive communication is marked by language of confusion, tangential thoughts. Articulation is mildly impaired. Samuel Lee has very limited insight into acute deficits even in the moment while Samuel Lee is having trouble, therefore needing Mod cues for simple, functional problem solving. Work up is still underway for underlying etiology, but at this point recommend SLP f/u at next level of care with 24/7 supervision. Will continue to follow acutely.      SLP Assessment  SLP Recommendation/Assessment: Patient needs continued Speech Lanaguage  Pathology Services SLP Visit Diagnosis: Cognitive communication deficit (R41.841)    Follow Up Recommendations  24 hour supervision/assistance;Other (comment) (f/u SLP at next venue of care)    Frequency and Duration min 2x/week  2 weeks      SLP Evaluation Cognition  Overall Cognitive Status: Impaired/Different from baseline Arousal/Alertness: Awake/alert Orientation Level: Oriented to person;Oriented to place;Oriented to time;Disoriented to situation Attention: Sustained Sustained Attention: Impaired Sustained Attention Impairment: Verbal basic;Functional basic Memory: Impaired Memory Impairment: Storage deficit Awareness: Impaired Awareness Impairment: Intellectual impairment;Emergent impairment Problem Solving: Impaired Problem Solving Impairment: Functional basic Safety/Judgment: Impaired       Comprehension  Auditory Comprehension Overall Auditory Comprehension: Impaired Yes/No Questions: Within Functional Limits Commands: Impaired One Step Basic Commands: 50-74% accurate Conversation: Simple    Expression Expression Primary Mode of Expression: Verbal Verbal Expression Overall Verbal Expression: Impaired Initiation: No impairment Repetition: Impaired Level of Impairment: Sentence level Pragmatics: Impairment Impairments: Topic maintenance Non-Verbal Means of Communication: Not applicable   Oral / Motor  Oral Motor/Sensory Function Overall Oral Motor/Sensory Function: Mild impairment Facial ROM: Reduced left;Suspected CN VII (facial) dysfunction Facial Symmetry: Abnormal symmetry left;Suspected CN VII (facial) dysfunction Facial Strength: Reduced left;Suspected CN VII (facial) dysfunction Lingual ROM: Within Functional Limits Lingual Symmetry: Within Functional Limits Lingual Strength: Within Functional Limits Velum: Within Functional Limits Mandible: Within Functional Limits Motor Speech Overall Motor Speech: Impaired Respiration: Within functional  limits Phonation: Normal Resonance: Within functional limits Articulation: Impaired Level of Impairment: Conversation Intelligibility: Intelligibility reduced Word: 75-100% accurate Phrase: 75-100% accurate Sentence: 75-100% accurate Conversation: 75-100% accurate   GO  Osie Bond., M.A. [place] [facility] Services Pager [phone] Office [phone]  05/06/2020, 3:59 PM

## 2020-05-06 NOTE — Evaluation (Signed)
Physical Therapy Evaluation Patient Details Name: Samuel Lee MRN: [id number] DOB: 1951/01/01 Today's Date: 05/06/2020   History of Present Illness  Pt is a 69 yo male presenting with sudden onset L sided weakness, slurred speech, and aphasia s/p tPA. CTH and MRI negative. EEG performed and suggestive of mild diffuse encephalopathy. PMHx includes melanoma, arthritis   Clinical Impression   Pt presents with generalized weakness, UE>LE tremors, impaired sitting and standing balance, AMS vs baseline, and decreased activity tolerance vs baseline. Pt to benefit from acute PT to address deficits. Pt ambulated short room distance with mod +2 assist to steady, pt limited by urinary incontinence and short, unsteady steps during ambulation. Pt hallucinating "dog running up a tree" during session, and named his wife as "my neighbor", at baseline pt is [facility] cognition and ambulates 10 miles a day. PT recommending CIR post-acutely to return to PLOF. PT to progress mobility as tolerated, and will continue to follow acutely.      Follow Up Recommendations CIR    Equipment Recommendations  None recommended by PT    Recommendations for Other Services       Precautions / Restrictions Precautions Precautions: Fall Restrictions Weight Bearing Restrictions: No      Mobility  Bed Mobility Overal bed mobility: Needs Assistance Bed Mobility: Supine to Sit;Sit to Supine     Supine to sit: Mod assist;+2 for physical assistance;+2 for safety/equipment Sit to supine: Mod assist;+2 for physical assistance;+2 for safety/equipment   General bed mobility comments: pt with difficulty motor planning/sequencing LEs towards EOB and requiring multimodal cues. assist to fully advance LEs over edge and for trunk stailization initially   Transfers Overall transfer level: Needs assistance Equipment used: 2 person hand held assist Transfers: Sit to/from Stand Sit to Stand: Min assist;Mod assist;+2 physical  assistance;+2 safety/equipment         General transfer comment: pt unsteady upon standing and with posterior bias, requires assist to stabilize and for wt shifting forward   Ambulation/Gait Ambulation/Gait assistance: Mod assist;+2 physical assistance;+2 safety/equipment Gait Distance (Feet): 5 Feet Assistive device: 2 person hand held assist Gait Pattern/deviations: Step-through pattern;Decreased stride length;Shuffle;Trunk flexed Gait velocity: decr   General Gait Details: Mod +2 for steadying, correcting posterior bias, limited by urinary incontinence  Stairs            Wheelchair Mobility    Modified Rankin (Stroke Patients Only)       Balance Overall balance assessment: Needs assistance Sitting-balance support: Feet supported;Bilateral upper extremity supported Sitting balance-Leahy Scale: Fair Sitting balance - Comments: pt typically reliant on UE support for balance, posterior drift with LE MMT    Standing balance support: Single extremity supported;Bilateral upper extremity supported Standing balance-Leahy Scale: Poor Standing balance comment: reliant on external assist                              Pertinent Vitals/Pain Pain Assessment: Faces Faces Pain Scale: No hurt Pain Intervention(s): Limited activity within patient's tolerance;Monitored during session    Home Living Family/patient expects to be discharged to:: Private residence Living Arrangements: Spouse/significant other Available Help at Discharge: Family Type of Home: House Home Access: Stairs to enter   Technical brewer of Steps: 1 and 1 Home Layout: One level Home Equipment: None      Prior Function Level of Independence: Independent         Comments: pt typically walks approx 10 miles a day, per wife pt ambulates for  3-4 hours every am     Hand Dominance   Dominant Hand: Right    Extremity/Trunk Assessment   Upper Extremity Assessment Upper Extremity  Assessment: Defer to OT evaluation RUE Deficits / Details: tremors noted bil, strength appears WFL, grossly symmetrical  LUE Deficits / Details: tremors noted bil, strength appears WFL, grossly symmetrical     Lower Extremity Assessment Lower Extremity Assessment: Generalized weakness    Cervical / Trunk Assessment Cervical / Trunk Assessment: Normal  Communication   Communication: Expressive difficulties (slurred speech)  Cognition Arousal/Alertness: Awake/alert Behavior During Therapy: Flat affect;Impulsive Overall Cognitive Status: Impaired/Different from baseline Area of Impairment: Orientation;Attention;Memory;Following commands;Safety/judgement;Awareness;Problem solving                 Orientation Level: Disoriented to;Time;Situation ("2001") Current Attention Level: Sustained Memory: Decreased short-term memory;Decreased recall of precautions Following Commands: Follows one step commands with increased time Safety/Judgement: Decreased awareness of safety;Decreased awareness of deficits Awareness: Intellectual Problem Solving: Slow processing;Decreased initiation;Difficulty sequencing;Requires verbal cues;Requires tactile cues General Comments: per RN report pt attempting to get OOB on more than one occasion prior to therapy eval. pt having difficulty motor planning/sequencing mobility tasks. impulsive at times. pt appears to have hallucinations x1 instance asking about the cat/dog that ran up the tree outside       General Comments General comments (skin integrity, edema, etc.): BP elevated though unsure of accuracy given pt cognition and difficulty keeping UE still. sposue present and assisting to provide home setup/PLOF.    Exercises     Assessment/Plan    PT Assessment Patient needs continued PT services  PT Problem List Decreased strength;Decreased mobility;Decreased balance;Decreased knowledge of use of DME;Pain;Decreased activity tolerance;Decreased range of  motion;Decreased cognition;Cardiopulmonary status limiting activity;Decreased safety awareness       PT Treatment Interventions DME instruction;Therapeutic activities;Gait training;Therapeutic exercise;Patient/family education;Balance training;Stair training;Functional mobility training;Neuromuscular re-education    PT Goals (Current goals can be found in the Care Plan section)  Acute Rehab PT Goals Patient Stated Goal: none stated, agreeable to working with therapy PT Goal Formulation: With patient Time For Goal Achievement: 05/20/20 Potential to Achieve Goals: Good    Frequency Min 3X/week   Barriers to discharge        Co-evaluation PT/OT/SLP Co-Evaluation/Treatment: Yes Reason for Co-Treatment: For patient/therapist safety;Necessary to address cognition/behavior during functional activity;Complexity of the patient's impairments (multi-system involvement) PT goals addressed during session: Mobility/safety with mobility;Balance OT goals addressed during session: ADL's and self-care       AM-PAC PT "6 Clicks" Mobility  Outcome Measure Help needed turning from your back to your side while in a flat bed without using bedrails?: A Little Help needed moving from lying on your back to sitting on the side of a flat bed without using bedrails?: A Lot Help needed moving to and from a bed to a chair (including a wheelchair)?: A Lot Help needed standing up from a chair using your arms (e.g., wheelchair or bedside chair)?: A Lot Help needed to walk in hospital room?: A Lot Help needed climbing 3-5 steps with a railing? : Total 6 Click Score: 12    End of Session Equipment Utilized During Treatment: Gait belt Activity Tolerance: Patient tolerated treatment well;Patient limited by fatigue Patient left: in bed;with bed alarm set;with family/visitor present;with call bell/phone within reach Nurse Communication: Mobility status PT Visit Diagnosis: Difficulty in walking, not elsewhere  classified (R26.2);Unsteadiness on feet (R26.81)    Time: 1610-9604 PT Time Calculation (min) (ACUTE ONLY): 26 min   Charges:  PT Evaluation $PT Eval Low Complexity: 1 Low        Rohn Fritsch E, PT Acute Rehabilitation Services Pager [phone]  Office [phone]    Julien Girt 05/06/2020, 5:23 PM

## 2020-05-06 NOTE — Procedures (Signed)
Patient Name: Samuel Lee  MRN: [id number]  Epilepsy Attending: Lora Havens  Referring Physician/Provider: Burnetta Sabin, NP Date: 05/06/2020 Duration: 24.17 minutes  Patient history: 69 year old male who presented with sudden onset of left-sided weakness, aphasia and slurred speech. MRI brain did not show any acute infarct. EEG to evaluate for seizure.  Level of alertness: Awake  AEDs during EEG study: None  Technical aspects: This EEG study was done with scalp electrodes positioned according to the 10-20 International system of electrode placement. Electrical activity was acquired at a sampling rate of 500Hz  and reviewed with a high frequency filter of 70Hz  and a low frequency filter of 1Hz . EEG data were recorded continuously and digitally stored.   Description: The posterior dominant rhythm consists of 8-9 Hz activity of moderate voltage (25-35 uV) seen predominantly in posterior head regions, symmetric and reactive to eye opening and eye closing. EEG showed continuous generalized 3 to 6 Hz theta-delta slowing. Hyperventilation and photic stimulation were not performed.     ABNORMALITY -Continuous slow, generalized  IMPRESSION: This study is suggestive of mild diffuse encephalopathy, nonspecific etiology no seizures or epileptiform discharges were seen throughout the recording.  Surya Schroeter Barbra Sarks

## 2020-05-06 NOTE — Progress Notes (Signed)
Spoke with Dr. Leonie Man regarding increased confusion and agitation. Patient attempting to get out of bed and unable to redirect. Patients wife states he has been drinking more alcohol than usual. A few beers and class of whiskey each night. New orders obtained for posey waist belt restraint and CIWA protocol.

## 2020-05-06 NOTE — Research (Signed)
Discussed participation in [place] study with both Rally Ouch and his wife, Khalifa Knecht. Mr. Birdwell is slightly disoriented at the time, but his wife agreed to participate in the study. Information sheet provided to Mrs. Granier. She understands he will have two questionnaires, one at time of discharge and another 90 days later. No questions at this time.

## 2020-05-06 NOTE — Progress Notes (Signed)
Obtained report from 4N rn, patient transport to 3w-10

## 2020-05-06 NOTE — Evaluation (Addendum)
Occupational Therapy Evaluation Patient Details Name: Samuel Lee MRN: [id number] DOB: [birth year] Today's Date: 05/06/2020    History of Present Illness Pt is a 69 yo male presenting with sudden onset L sided weakness, slurred speech, and aphasia s/p tPA. CTH and MRI negative. EEG performed and suggestive of mild diffuse encephalopathy. PMHx includes melanoma, arthritis    Clinical Impression   This 69 y/o male presents with the above. PTA pt very independent with ADL and mobility tasks, spouse reports he typically walks approx 10 miles/day. Pt now presenting with impaired cognition, weakness, decreased sitting/standing balance. Pt with poor motor planning and requiring up to two person assist for bed mobility and functional transfers (using HHA), able to take few steps forward/back from EOB (limited due to urgent need to urinate while standing, needing to reattach primofit to suction). Pt requiring up to maxA (+2) for LB ADL, modA for self-feeding and UB ADL. Pt will benefit from continued acute OT services, am hopeful he will progress well however given current deficits feel he will benefit from CIR level therapies at time of discharge to maximize his overall safety and independence with ADL and mobility.     Follow Up Recommendations  CIR    Equipment Recommendations  Tub/shower seat    Recommendations for Other Services Rehab consult     Precautions / Restrictions Precautions Precautions: Fall Restrictions Weight Bearing Restrictions: No      Mobility Bed Mobility Overal bed mobility: Needs Assistance Bed Mobility: Supine to Sit;Sit to Supine     Supine to sit: Mod assist;+2 for physical assistance;+2 for safety/equipment Sit to supine: Mod assist;+2 for physical assistance;+2 for safety/equipment   General bed mobility comments: pt with difficulty motor planning/sequencing LEs towards EOB and requiring multimodal cues. assist to fully advance LEs over edge and for trunk  stailization initially   Transfers Overall transfer level: Needs assistance Equipment used: 2 person hand held assist Transfers: Sit to/from Stand Sit to Stand: Min assist;Mod assist;+2 physical assistance;+2 safety/equipment         General transfer comment: pt unsteady upon standing and with posterior bias, requires assist to stabilize and for wt shifting forward     Balance Overall balance assessment: Needs assistance Sitting-balance support: Feet supported;Bilateral upper extremity supported Sitting balance-Leahy Scale: Fair Sitting balance - Comments: pt typically reliant on UE support for balance, posterior drift with LE MMT    Standing balance support: Single extremity supported;Bilateral upper extremity supported Standing balance-Leahy Scale: Poor Standing balance comment: reliant on external assist                            ADL either performed or assessed with clinical judgement   ADL Overall ADL's : Needs assistance/impaired Eating/Feeding: Moderate assistance;Sitting Eating/Feeding Details (indicate cue type and reason): assist to grasp cup and bring to mouth initially given tremors in hand, multiple attempts/cues to swallow pills that RN had administered (using both apple sauce and sips of water) Grooming: Moderate assistance;Sitting   Upper Body Bathing: Minimal assistance;Sitting   Lower Body Bathing: Moderate assistance;+2 for physical assistance;+2 for safety/equipment;Sit to/from stand   Upper Body Dressing : Minimal assistance;Sitting   Lower Body Dressing: Moderate assistance;+2 for physical assistance;+2 for safety/equipment;Sit to/from stand   Toilet Transfer: +2 for physical assistance;+2 for safety/equipment;Stand-pivot;Minimal assistance Toilet Transfer Details (indicate cue type and reason): via HHA, simulated via steps to/from EOB   Toileting - Clothing Manipulation Details (indicate cue type and reason): pt with  primofit currently -  with urgency to urinate after taking few steps away from EOB so returned to EOB and reconnected to suction      Functional mobility during ADLs: Moderate assistance;+2 for physical assistance;+2 for safety/equipment (HHA, minA+2 up to modA+2)       Vision Baseline Vision/History: Wears glasses Wears Glasses:  ("majority of the time") Additional Comments: pt able to read large print on bathroom door (without glasses) and medium sized font on sign next to door but requiring wearing his glasses to do so. will continue to assess within functional context      Perception     Praxis      Pertinent Vitals/Pain Pain Assessment: Faces Faces Pain Scale: No hurt Pain Intervention(s): Monitored during session     Hand Dominance Right   Extremity/Trunk Assessment Upper Extremity Assessment Upper Extremity Assessment: Difficult to assess due to impaired cognition;RUE deficits/detail;LUE deficits/detail RUE Deficits / Details: tremors noted bil, strength appears WFL, grossly symmetrical  LUE Deficits / Details: tremors noted bil, strength appears WFL, grossly symmetrical    Lower Extremity Assessment Lower Extremity Assessment: Defer to PT evaluation       Communication Communication Communication: Expressive difficulties   Cognition Arousal/Alertness: Awake/alert Behavior During Therapy: Flat affect;Impulsive Overall Cognitive Status: Impaired/Different from baseline Area of Impairment: Orientation;Attention;Memory;Following commands;Safety/judgement;Awareness;Problem solving                 Orientation Level: Disoriented to;Time;Situation ("2001") Current Attention Level: Sustained Memory: Decreased short-term memory;Decreased recall of precautions Following Commands: Follows one step commands with increased time Safety/Judgement: Decreased awareness of safety;Decreased awareness of deficits Awareness: Intellectual Problem Solving: Slow processing;Decreased  initiation;Difficulty sequencing;Requires verbal cues;Requires tactile cues General Comments: per RN report pt attempting to get OOB on more than one occasion prior to therapy eval. pt having difficulty motor planning/sequencing mobility tasks. impulsive at times. pt appears to have hallucinations x1 instance asking about the cat/dog that ran up the tree outside    General Comments  BP elevated though unsure of accuracy given pt cognition and difficulty keeping UE still. sposue present and assisting to provide home setup/PLOF.    Exercises     Shoulder Instructions      Home Living Family/patient expects to be discharged to:: Private residence Living Arrangements: Spouse/significant other Available Help at Discharge: Family Type of Home: House Home Access: Stairs to enter Technical brewer of Steps: 1 and 1   Home Layout: One level     Bathroom Shower/Tub: Occupational psychologist: Standard     Home Equipment: None          Prior Functioning/Environment Level of Independence: Independent        Comments: pt typically walks approx 10 miles a day         OT Problem List: Decreased strength;Decreased range of motion;Decreased activity tolerance;Impaired balance (sitting and/or standing);Decreased cognition;Decreased safety awareness;Impaired UE functional use;Decreased knowledge of precautions;Decreased knowledge of use of DME or AE      OT Treatment/Interventions: Self-care/ADL training;Therapeutic exercise;Energy conservation;DME and/or AE instruction;Therapeutic activities;Cognitive remediation/compensation;Patient/family education;Balance training    OT Goals(Current goals can be found in the care plan section) Acute Rehab OT Goals Patient Stated Goal: none stated, agreeable to working with therapy OT Goal Formulation: With patient Time For Goal Achievement: 05/20/20 Potential to Achieve Goals: Good  OT Frequency: Min 2X/week   Barriers to D/C:             Co-evaluation PT/OT/SLP Co-Evaluation/Treatment: Yes Reason for Co-Treatment: For patient/therapist  safety;Necessary to address cognition/behavior during functional activity   OT goals addressed during session: ADL's and self-care      AM-PAC OT "6 Clicks" Daily Activity     Outcome Measure Help from another person eating meals?: A Lot Help from another person taking care of personal grooming?: A Lot Help from another person toileting, which includes using toliet, bedpan, or urinal?: A Lot Help from another person bathing (including washing, rinsing, drying)?: A Lot Help from another person to put on and taking off regular upper body clothing?: A Lot Help from another person to put on and taking off regular lower body clothing?: A Lot 6 Click Score: 12   End of Session Equipment Utilized During Treatment: Gait belt Nurse Communication: Mobility status  Activity Tolerance: Patient tolerated treatment well Patient left: in bed;with call bell/phone within reach;with bed alarm set;with restraints reapplied;with family/visitor present  OT Visit Diagnosis: Unsteadiness on feet (R26.81);Other symptoms and signs involving cognitive function                Time: 1423-9532 OT Time Calculation (min): 26 min Charges:  OT General Charges $OT Visit: 1 Visit OT Evaluation $OT Eval Moderate Complexity: [place], OT Acute Rehabilitation Services Pager [phone] Office [phone]   Raymondo Band 05/06/2020, 4:50 PM

## 2020-05-06 NOTE — Progress Notes (Signed)
EEG completed, results pending. 

## 2020-05-07 ENCOUNTER — Inpatient Hospital Stay (HOSPITAL_COMMUNITY): Payer: PPO

## 2020-05-07 LAB — COMPREHENSIVE METABOLIC PANEL
ALT: 18 U/L (ref 0–44)
AST: 25 U/L (ref 15–41)
Albumin: 4.2 g/dL (ref 3.5–5.0)
Alkaline Phosphatase: 39 U/L (ref 38–126)
Anion gap: 12 (ref 5–15)
BUN: 17 mg/dL (ref 8–23)
CO2: 19 mmol/L — ABNORMAL LOW (ref 22–32)
Calcium: 8.5 mg/dL — ABNORMAL LOW (ref 8.9–10.3)
Chloride: 105 mmol/L (ref 98–111)
Creatinine, Ser: 1.04 mg/dL (ref 0.61–1.24)
GFR, Estimated: >60 mL/min (ref >60)
Glucose, Bld: 84 mg/dL (ref 70–99)
Potassium: 3.1 mmol/L — ABNORMAL LOW (ref 3.5–5.1)
Sodium: 136 mmol/L (ref 135–145)
Total Bilirubin: 1.4 mg/dL — ABNORMAL HIGH (ref 0.3–1.2)
Total Protein: 6.2 g/dL — ABNORMAL LOW (ref 6.5–8.1)

## 2020-05-07 LAB — CBC
HCT: 45 % (ref 39.0–52.0)
Hemoglobin: 15.6 g/dL (ref 13.0–17.0)
MCH: 31.6 pg (ref 26.0–34.0)
MCHC: 34.7 g/dL (ref 30.0–36.0)
MCV: 91.1 fL (ref 80.0–100.0)
Platelets: 179 10*3/uL (ref 150–400)
RBC: 4.94 MIL/uL (ref 4.22–5.81)
RDW: 12.5 % (ref 11.5–15.5)
WBC: 10.4 10*3/uL (ref 4.0–10.5)
nRBC: 0 % (ref 0.0–0.2)

## 2020-05-07 LAB — MAGNESIUM: Magnesium: 2.2 mg/dL (ref 1.7–2.4)

## 2020-05-07 LAB — PHOSPHORUS: Phosphorus: 2.9 mg/dL (ref 2.5–4.6)

## 2020-05-07 LAB — GLUCOSE, CAPILLARY: Glucose-Capillary: 103 mg/dL — ABNORMAL HIGH (ref 70–99)

## 2020-05-07 MED ORDER — PANTOPRAZOLE SODIUM 40 MG PO TBEC
40.0000 mg | DELAYED_RELEASE_TABLET | Freq: Every day | ORAL | Status: DC
  Administered 2020-05-07 – 2020-05-10 (×4): 40 mg via ORAL
  Filled 2020-05-07 (×4): qty 1

## 2020-05-07 MED ORDER — TERAZOSIN HCL 1 MG PO CAPS
1.0000 mg | ORAL_CAPSULE | Freq: Every day | ORAL | Status: DC
  Administered 2020-05-07 – 2020-05-10 (×4): 1 mg via ORAL
  Filled 2020-05-07 (×5): qty 1

## 2020-05-07 MED ORDER — POTASSIUM CHLORIDE CRYS ER 20 MEQ PO TBCR
40.0000 meq | EXTENDED_RELEASE_TABLET | ORAL | Status: AC
  Administered 2020-05-07 (×2): 40 meq via ORAL
  Filled 2020-05-07 (×2): qty 2

## 2020-05-07 MED ORDER — AMLODIPINE BESYLATE 5 MG PO TABS
5.0000 mg | ORAL_TABLET | Freq: Every day | ORAL | Status: DC
  Administered 2020-05-07 – 2020-05-11 (×5): 5 mg via ORAL
  Filled 2020-05-07 (×5): qty 1

## 2020-05-07 NOTE — Plan of Care (Addendum)
LTM EEG reviewed till 1400 on 05/24/2020.  During awake state, no clear posterior dominant rhythm was seen.  Sleep was characterized by sleep spindles (13 to 15 Hz), maximal frontocentral region.  EEG showed continuous generalized 3 to 6 Hz theta-delta slowing consistent with mild to moderate encephalopathy.  No seizures and epileptiform discharges were seen during the study.  Please review final report for details.

## 2020-05-07 NOTE — Progress Notes (Signed)
PHARMACIST - PHYSICIAN COMMUNICATION  DR:   Leonie Man  CONCERNING: IV to Oral Route Change Policy  RECOMMENDATION: This patient is receiving Pantoprazole by the intravenous route.  Based on criteria approved by the Pharmacy and Therapeutics Committee, the intravenous medication(s) is/are being converted to the equivalent oral dose form(s).   DESCRIPTION: These criteria include:  The patient is eating (either orally or via tube) and/or has been taking other orally administered medications for a least 24 hours  The patient has no evidence of active gastrointestinal bleeding or impaired GI absorption (gastrectomy, short bowel, patient on TNA or NPO).  If you have questions about this conversion, please contact the Pharmacy Department  []   [phone] )  [facility] []   [phone] )  [facility] []   [phone] )  [facility] []   [phone] )  [facility] []   [phone] )  [place] A. Levada Dy, PharmD, BCPS, FNKF Clinical Pharmacist [place] Please utilize Amion for appropriate phone number to reach the unit pharmacist ([place])   05/07/2020 2:28 PM

## 2020-05-07 NOTE — Progress Notes (Signed)
STROKE TEAM PROGRESS NOTE   INTERVAL HISTORY Wife at bedside. Patient with dried blood around mouth in inside mouth. Confused.  No definite witnessed seizure activity but patient remains confused and is not back to his baseline.    Vitals:   05/06/20 2013 05/06/20 2332 05/07/20 0334 05/07/20 0751  BP:  (!) 153/98 (!) 160/94 (!) 162/88  Pulse: 79 86 83 89  Resp:  20 20 20   Temp:  98.9 F (37.2 C) 98.9 F (37.2 C) 98.4 F (36.9 C)  TempSrc:   Axillary Axillary  SpO2:  97% 100% 99%  Weight:      Height:       CBC:  Recent Labs  Lab 05/05/20 1036 05/05/20 1036 05/05/20 1047 05/07/20 0054  WBC 7.6  --   --  10.4  NEUTROABS 6.2  --   --   --   HGB 16.3   < > 15.3 15.6  HCT 47.7   < > 45.0 45.0  MCV 93.0  --   --  91.1  PLT 154  --   --  179   < > = values in this interval not displayed.   Basic Metabolic Panel:  Recent Labs  Lab 05/05/20 1036 05/05/20 1036 05/05/20 1047 05/07/20 0054  NA 139   < > 140 136  K 3.7   < > 3.7 3.1*  CL 105   < > 106 105  CO2 21*  --   --  19*  GLUCOSE 145*   < > 150* 84  BUN 17   < > 20 17  CREATININE 0.87   < > 0.70 1.04  CALCIUM 9.1  --   --  8.5*  MG  --   --   --  2.2  PHOS  --   --   --  2.9   < > = values in this interval not displayed.   Lipid Panel:  Recent Labs  Lab 05/06/20 0417  CHOL 180  TRIG 47  HDL 69  CHOLHDL 2.6  VLDL 9  LDLCALC 102*   HgbA1c:  Recent Labs  Lab 05/06/20 0417  HGBA1C 4.6*   Urine Drug Screen:  Recent Labs  Lab 05/05/20 1248  LABOPIA NONE DETECTED  COCAINSCRNUR NONE DETECTED  LABBENZ NONE DETECTED  AMPHETMU NONE DETECTED  THCU NONE DETECTED  LABBARB NONE DETECTED    Alcohol Level  Recent Labs  Lab 05/05/20 1036  ETH <10    IMAGING past 24 hours MR BRAIN WO CONTRAST  Result Date: 05/06/2020 CLINICAL DATA:  Acute stroke presentation. Left-sided weakness and slurred speech. EXAM: MRI HEAD WITHOUT CONTRAST TECHNIQUE: Multiplanar, multiecho pulse sequences of the brain and  surrounding structures were obtained without intravenous contrast. COMPARISON:  CT studies done yesterday. FINDINGS: Brain: The study suffers from motion degradation, but the diffusion imaging is of good quality. There is no acute or subacute infarction based on diffusion imaging. Otherwise, brain does not appear to show accelerated atrophy for age. I do not see evidence of widespread small-vessel disease. No hemorrhage, hydrocephalus or extra-axial collection is visible. Vascular: Major vessels at the base of the brain show flow. Skull and upper cervical spine: Negative Sinuses/Orbits: Clear sinuses.  Orbital detail quite poor. Other: None IMPRESSION: Motion degraded exam. Diffusion imaging is of good quality. No sign of acute or subacute infarction. No sign of widespread small-vessel disease. Electronically Signed   By: Nelson Chimes M.D.   On: 05/06/2020 11:38   EEG adult  Result Date: 05/06/2020  Lora Havens, MD     05/06/2020  1:01 PM Patient Name: Samuel Lee MRN: [id number] Epilepsy Attending: Lora Havens Referring Physician/Provider: Burnetta Sabin, NP Date: 05/06/2020 Duration: 24.17 minutes Patient history: 69 year old male who presented with sudden onset of left-sided weakness, aphasia and slurred speech. MRI brain did not show any acute infarct. EEG to evaluate for seizure. Level of alertness: Awake AEDs during EEG study: None Technical aspects: This EEG study was done with scalp electrodes positioned according to the 10-20 International system of electrode placement. Electrical activity was acquired at a sampling rate of 500Hz  and reviewed with a high frequency filter of 70Hz  and a low frequency filter of 1Hz . EEG data were recorded continuously and digitally stored. Description: The posterior dominant rhythm consists of 8-9 Hz activity of moderate voltage (25-35 uV) seen predominantly in posterior head regions, symmetric and reactive to eye opening and eye closing. EEG showed continuous  generalized 3 to 6 Hz theta-delta slowing. Hyperventilation and photic stimulation were not performed.   ABNORMALITY -Continuous slow, generalized IMPRESSION: This study is suggestive of mild diffuse encephalopathy, nonspecific etiology no seizures or epileptiform discharges were seen throughout the recording. [place]    Pleasant elderly Caucasian male not in distress. . Afebrile. Head is nontraumatic. Neck is supple without bruit.    Cardiac exam no murmur or gallop. Lungs are clear to auscultation. Distal pulses are well felt. Neurological Exam ;  Awake  Alert oriented x 2. Diminished attention registration and recall. Easily distractible..Moderately dysarthric speech and l follows one and two-step commands. Eye movements full without nystagmus.fundi were not visualized. Vision acuity and fields appear normal. Hearing is normal. Palatal movements are normal. Face symmetric with only subtle left nasolabial fold asymmetry when he smiles.. Tongue midline. Normal strength, tone, reflexes and coordination. Normal sensation. Gait deferred.   ASSESSMENT/PLAN Mr. Calahan Pak is a 69 y.o. R handed male with history of melanoma and arthritis presenting with L sided weakness, aphasia and slurred speech. Received tPA 05/05/2020 at 1052.  Stroke-like symptoms s/p tPA, TIA vs Seizures and postictal confusion  Code Stroke CT head No acute abnormality. ASPECTS 10.     CTA head & neck no LVO. Mild atherosclerosis B V4 <30%  MRI  No acute infarct  2D Echo EF 45-50%. No source of embolus   EEG continuous slowing, generalizaed  LT EEG  pending   LDL 102  HgbA1c 4.6  VTE prophylaxis - SCDs on  No antithrombotic prior to admission, now on No antithrombotic. Added DAPT x 3 weeks then aspirin alone    Therapy recommendations:  CIR - admission coordinator is following. Does not have a CIR supportive dx at this time.  (he routinely walks 10 miles/day, social butterfly of the  neightborhood).   Disposition:  pending (lives w/ wife, retired 1 yr ago)  Posey placed 10/12 for ongoing confusion, trying to get OOB without assistance  Hypertension post tPA  New medication terazosin, risk of HTN  Home meds:  None  BP goal normotensive . Treated w/ cleviprex, now off . Add norvasc 5 . Long-term BP goal normotensive  Hyperlipidemia  Home meds:  No statin  LDL 102, goal < 70  Consider statin   Dysphagia . Secondary to stroke . Cleared for diet . Speech on board   Other Stroke Risk Factors  Advanced age  ETOH use, alcohol level <10, advised to drink no more than 2 drink(s) a day. Added CIWA protocol for increased  alcohol intake per wife. Received 3 doses of ativan since yesterday afternoon. Remains confused, agitated at time.   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  Recommend long-term EEG monitoring to look for epileptic genic activity found may need to put on anticonvulsants.  Long discussion with patient and wife and answered questions.  Discussed with Dr. Hortense Ramal.  Greater than 50% time during this 25-minute visit was spent on counseling and coordination of care about his confusion and suspected seizure and answering questions.   Antony Contras, MD  To contact Stroke Continuity provider, please refer to [email or web address] After hours, contact General Neurology

## 2020-05-07 NOTE — Progress Notes (Signed)
  Speech Language Pathology Treatment: Dysphagia  Patient Details Name: Samuel Lee MRN: [id number] DOB: November 01, 1950 Today's Date: 05/07/2020 Time: 2951-8841 SLP Time Calculation (min) (ACUTE ONLY): 14 min  Assessment / Plan / Recommendation Clinical Impression  Pt required total cues to encourage him to consume intake of water for oral moisture and to help clear blood secretions retained on soft palate.  He demonstrated functional swallow with minimal amount of po he accepted.  Breakfast tray at bedside is hardly touched and he was disoriented to his meal items stating the orange juice was "not his".  SLP set up oral suction for pt and demonstrated its use to pt and to spouse when she arrived.  Had spouse examine pt's right buccal region and advised pt to [facility] on his left side or order foods that don't require excessive mastication *he is on soft diet.    Pt appears frustrated today stating "I don't want to be here" and required verbal cues to understand he is in the hospital and his wife is not trying to "have him commited".  His rate of speech is pressed with imprecise articulation and needs mod verbal cues to follow directions to slow rate for improved intelligibility.    Basic problem solving with spouse reviewed advising her to have pt call for assist when needed even with her the in room. Will follow for cog ling treatment and dysphagia management - but uncertain to progression due to unknown source of deficit.demonstrated functional swallow with minimal amount of po he accepted.  Breakfast tray at bedside is hardly touched.    HPI HPI: Pt is a 69 yo male presenting with sudden onset L sided weakness, slurred speech, and aphasia s/p tPA. CTH and MRI negative. PMH includes: melanoma, arthritis      SLP Plan  Continue with current plan of care       Recommendations  Diet recommendations: Dysphagia 3 (mechanical soft);Thin liquid Liquids provided via: Cup;No straw Medication  Administration: Whole meds with puree Supervision: Full supervision/cueing for compensatory strategies Compensations: Minimize environmental distractions;Slow rate;Small sips/bites (masticate on left)                Oral Care Recommendations: Oral care BID Follow up Recommendations:  (tba) SLP Visit Diagnosis: Dysphagia, unspecified (R13.10);Cognitive communication deficit (Y60.630) Plan: Continue with current plan of care       GO                Macario Golds 05/07/2020, 10:43 AM  Kathleen Lime, MS [facility] SLP Acute Rehab Services Office [phone] Pager [phone]

## 2020-05-07 NOTE — TOC Initial Note (Signed)
Transition of Care [facility]) - Initial/Assessment Note    Patient Details  Name: Samuel Lee MRN: [id number] Date of Birth: 09-30-1950  [facility] CM/SW Contact:    Carles Collet, RN Phone Number: 05/07/2020, 1:59 PM  Clinical Narrative:           Patient from home w wife. Admitted with Stroke-like symptoms s/p tPA, TIA vs Seizures and postictal confusion. CM will continue to follow.        Expected Discharge Plan:  (TBD) Barriers to Discharge: Continued Medical Work up   Patient Goals and CMS Choice        Expected Discharge Plan and Services Expected Discharge Plan:  (TBD)                                              Prior Living Arrangements/Services   Lives with:: Spouse                   Activities of Daily Living Home Assistive Devices/Equipment: Eyeglasses, Blood pressure cuff ADL Screening (condition at time of admission) Is the patient deaf or have difficulty hearing?: No Does the patient have difficulty seeing, even when wearing glasses/contacts?: No Does the patient have difficulty concentrating, remembering, or making decisions?: Yes Patient able to express need for assistance with ADLs?: Yes Does the patient have difficulty dressing or bathing?: Yes Independently performs ADLs?: No Communication: Needs assistance Is this a change from baseline?: Change from baseline, expected to last >3 days Dressing (OT): Needs assistance Is this a change from baseline?: Change from baseline, expected to last >3 days Grooming: Needs assistance Is this a change from baseline?: Change from baseline, expected to last >3 days Feeding: Needs assistance Is this a change from baseline?: Change from baseline, expected to last >3 days Bathing: Needs assistance Is this a change from baseline?: Change from baseline, expected to last >3 days Toileting: Needs assistance Is this a change from baseline?: Change from baseline, expected to last  >3days In/Out Bed: Needs assistance Is this a change from baseline?: Change from baseline, expected to last >3 days Walks in Home: Needs assistance Is this a change from baseline?: Change from baseline, expected to last >3 days Does the patient have difficulty walking or climbing stairs?: Yes Weakness of Legs: Left Weakness of Arms/Hands: Left  Permission Sought/Granted                  Emotional Assessment              Admission diagnosis:  Stroke (cerebrum) ([place]) [I63.9] Cerebrovascular accident (CVA), unspecified mechanism ([place]) [I63.9] Patient Active Problem List   Diagnosis Date Noted  . Stroke (cerebrum) ([place]) 05/05/2020   PCP:  Fanny Bien, MD Pharmacy:   [facility], [place] [address] [place] [address] Phone: [phone] Fax: [phone]     Social Determinants of Health (SDOH) Interventions    Readmission Risk Interventions No flowsheet data found.

## 2020-05-07 NOTE — Progress Notes (Signed)
vLTM EEG started. No skin breakdown during hookup. Tested event button. Notified Neuro

## 2020-05-07 NOTE — Progress Notes (Signed)
Inpatient Rehab Admissions Coordinator Note:   Per therapy recommendations, pt was screened for CIR candidacy by Clemens Catholic, [place] CCC-SLP. At this time, Pt. Appears to have functional decline, but etiology of deficits is unclear and deficits may resolve with time/medical intervention. I will not place CIR consult order at this time, but will follow and re-consider placing order if Pt.'s deficits persist.   Clemens Catholic, [place], [facility] Admissions Coordinator  [phone] (celll) [phone] (office)

## 2020-05-08 ENCOUNTER — Inpatient Hospital Stay (HOSPITAL_COMMUNITY): Payer: PPO

## 2020-05-08 LAB — BASIC METABOLIC PANEL
Anion gap: 9 (ref 5–15)
BUN: 15 mg/dL (ref 8–23)
CO2: 20 mmol/L — ABNORMAL LOW (ref 22–32)
Calcium: 8.7 mg/dL — ABNORMAL LOW (ref 8.9–10.3)
Chloride: 110 mmol/L (ref 98–111)
Creatinine, Ser: 0.82 mg/dL (ref 0.61–1.24)
GFR, Estimated: >60 mL/min (ref >60)
Glucose, Bld: 83 mg/dL (ref 70–99)
Potassium: 3.4 mmol/L — ABNORMAL LOW (ref 3.5–5.1)
Sodium: 139 mmol/L (ref 135–145)

## 2020-05-08 LAB — CBC
HCT: 42.1 % (ref 39.0–52.0)
Hemoglobin: 14.9 g/dL (ref 13.0–17.0)
MCH: 32.5 pg (ref 26.0–34.0)
MCHC: 35.4 g/dL (ref 30.0–36.0)
MCV: 91.9 fL (ref 80.0–100.0)
Platelets: 155 10*3/uL (ref 150–400)
RBC: 4.58 MIL/uL (ref 4.22–5.81)
RDW: 12.5 % (ref 11.5–15.5)
WBC: 8.4 10*3/uL (ref 4.0–10.5)
nRBC: 0 % (ref 0.0–0.2)

## 2020-05-08 LAB — FOLATE: Folate: 25.7 ng/mL (ref >5.9)

## 2020-05-08 LAB — VITAMIN B12: Vitamin B-12: 456 pg/mL (ref 180–914)

## 2020-05-08 LAB — TSH: TSH: 1.682 u[IU]/mL (ref 0.350–4.500)

## 2020-05-08 LAB — SEDIMENTATION RATE: Sed Rate: 7 mm/hr (ref 0–16)

## 2020-05-08 LAB — HIV ANTIBODY (ROUTINE TESTING W REFLEX): HIV Screen 4th Generation wRfx: NONREACTIVE

## 2020-05-08 MED ORDER — POTASSIUM CHLORIDE 20 MEQ PO PACK
40.0000 meq | PACK | ORAL | Status: AC
  Administered 2020-05-08 (×2): 40 meq via ORAL
  Filled 2020-05-08 (×2): qty 2

## 2020-05-08 MED ORDER — DIVALPROEX SODIUM 250 MG PO DR TAB
500.0000 mg | DELAYED_RELEASE_TABLET | Freq: Two times a day (BID) | ORAL | Status: DC
  Administered 2020-05-08 – 2020-05-11 (×6): 500 mg via ORAL
  Filled 2020-05-08 (×6): qty 2

## 2020-05-08 MED ORDER — VALPROATE SODIUM 500 MG/5ML IV SOLN
1000.0000 mg | Freq: Once | INTRAVENOUS | Status: AC
  Administered 2020-05-08: 1000 mg via INTRAVENOUS
  Filled 2020-05-08: qty 10

## 2020-05-08 MED ORDER — ENOXAPARIN SODIUM 40 MG/0.4ML ~~LOC~~ SOLN
40.0000 mg | SUBCUTANEOUS | Status: DC
  Administered 2020-05-08 – 2020-05-11 (×4): 40 mg via SUBCUTANEOUS
  Filled 2020-05-08 (×4): qty 0.4

## 2020-05-08 NOTE — Progress Notes (Signed)
vLTM EEG complete. No skin breakdown 

## 2020-05-08 NOTE — Progress Notes (Signed)
STROKE TEAM PROGRESS NOTE   INTERVAL HISTORY Wife noted chewing starting Monday, he would stop momentarily when asked but then resume. She also reported some hallucinations. Discussed seizures and possible alcohol withdrawal w/ wife and son who is on the phone. He did receive 3 doses ativan 10/12-10/13 w/in < 12h.  He is drowsy but can be aroused and seems oriented and follows commands well moves all 4 extremities against gravity.  No witnessed seizure activity.  Speech remains slurred slightly.   Long-term EEG monitoring for last 24 hours shows mild diffuse generalized slowing and no definite epileptiform activity.  Vitals:   05/07/20 1142 05/07/20 2328 05/08/20 0336 05/08/20 0750  BP: (!) 162/88 138/73 (!) 141/83 (!) 148/80  Pulse: 94 72 73 74  Resp: _0 Temp: 98 F (36.7 C) 98.1 F (36.7 C) 98 F (36.7 C) 98.4 F (36.9 C)  TempSrc: Oral Axillary Axillary Axillary  SpO2: 97% 98% 99% 96%  Weight:      Height:       CBC:  Recent Labs  Lab 05/05/20 1036 05/05/20 1036 05/05/20 1047 05/07/20 0054  WBC 7.6  --   --  10.4  NEUTROABS 6.2  --   --   --   HGB 16.3   < > 15.3 15.6  HCT 47.7   < > 45.0 45.0  MCV 93.0  --   --  91.1  PLT 154  --   --  179   < > = values in this interval not displayed.   Basic Metabolic Panel:  Recent Labs  Lab 05/07/20 0054 05/08/20 0442  NA 136 139  K 3.1* 3.4*  CL 105 110  CO2 19* 20*  GLUCOSE 84 83  BUN 17 15  CREATININE 1.04 0.82  CALCIUM 8.5* 8.7*  MG 2.2  --   PHOS 2.9  --    Lipid Panel:  Recent Labs  Lab 05/06/20 0417  CHOL 180  TRIG 47  HDL 69  CHOLHDL 2.6  VLDL 9  LDLCALC 102*   HgbA1c:  Recent Labs  Lab 05/06/20 0417  HGBA1C 4.6*   Urine Drug Screen:  Recent Labs  Lab 05/05/20 1248  LABOPIA NONE DETECTED  COCAINSCRNUR NONE DETECTED  LABBENZ NONE DETECTED  AMPHETMU NONE DETECTED  THCU NONE DETECTED  LABBARB NONE DETECTED    Alcohol Level  Recent Labs  Lab 05/05/20 1036  ETH <10    IMAGING  past 24 hours EEG LTVM - Continuous Bedside W/ Video Includes Portable EEG Read  Result Date: 05/08/2020 Lora Havens, MD     05/08/2020  9:14 AM Patient Name: Samuel Lee MRN: [id number] Epilepsy Attending: Lora Havens Referring Physician/Provider: Burnetta Sabin, NP Duration:  05/07/2020 1022 to 05/08/2020 0900  Patient history: 69 year old male who presented with sudden onset of left-sided weakness, aphasia and slurred speech. MRI brain did not show any acute infarct. EEG to evaluate for seizure.  Level of alertness: Awake, asleep AEDs during EEG study: None  Technical aspects: This EEG study was done with scalp electrodes positioned according to the 10-20 International system of electrode placement. Electrical activity was acquired at a sampling rate of 500Hz and reviewed with a high frequency filter of 70Hz and a low frequency filter of 1Hz. EEG data were recorded continuously and digitally stored.  Description: The posterior dominant rhythm consists of 8-9 Hz activity of moderate voltage (25-35 uV) seen predominantly in posterior head regions, symmetric and reactive to eye opening and eye closing.  EEG showed continuous generalized 3 to 6 Hz theta-delta slowing as well as generalized 13 to 15 Hz beta activity. Hyperventilation and photic stimulation were not performed.    ABNORMALITY -Continuous slow, generalized  IMPRESSION: This study is suggestive of mild diffuse encephalopathy, nonspecific etiology. No seizures or epileptiform discharges were seen throughout the recording.  [place]    Pleasant elderly Caucasian male not in distress. . Afebrile. Head is nontraumatic. Neck is supple without bruit.    Cardiac exam no murmur or gallop. Lungs are clear to auscultation. Distal pulses are well felt. Neurological Exam ;  Drowsy but can be aroused and oriented x 2. Diminished attention registration and recall. Easily distractible..Moderately dysarthric speech and   follows one and two-step commands. Eye movements full without nystagmus.fundi were not visualized. Vision acuity and fields appear normal. Hearing is normal. Palatal movements are normal. Face symmetric with only subtle left nasolabial fold asymmetry when he smiles.. Tongue midline. Normal strength, tone, reflexes and coordination. Normal sensation. Gait deferred.   ASSESSMENT/PLAN [name] with history of melanoma and arthritis presenting with L sided weakness, aphasia and slurred speech. Received tPA 05/05/2020 at 1052.  Stroke-like symptoms s/p tPA, TIA vs Seizures and postictal confusion versus encephalopathy from alcohol withdrawal  Code Stroke CT head No acute abnormality. ASPECTS 10.     CTA head & neck no LVO. Mild atherosclerosis B V4 <30%  MRI  No acute infarct  Repeat CT head today     2D Echo EF 45-50%. No source of embolus   EEG continuous slowing, generalizaed  LT EEG  Neg for sz  LDL 102  HgbA1c 4.6  Labs (folate, HIV, RPR, ESR, TSH, VB12) pending   VTE prophylaxis - changed to Lovenox 40 mg sq daily   No antithrombotic prior to admission, now on No antithrombotic. Added DAPT x 3 weeks then aspirin alone    Add depakote - load with 1gm followed by DR 500 bid  Therapy recommendations:  CIR - admission coordinator is following.  (he routinely walks 10 miles/day, social butterfly of the neight[place]hood).   Disposition:  pending (lives w/ wife, retired 1 yr ago)  Posey placed 10/12 for ongoing confusion, trying to get OOB without assistance. Has mittens on this am  Hypertension post tPA  New medication terazosin, risk of HTN  Home meds:  None  BP goal normotensive . Treated w/ cleviprex, now off . Add norvasc 5 . Long-term BP goal normotensive  Hyperlipidemia  Home meds:  No statin  LDL 102, goal < 70  Consider statin   Dysphagia, resolved . Secondary to stroke . Cleared for diet . Speech on board    Other Stroke Risk Factors  Advanced age  ETOH use, alcohol level <10, advised to drink no more than 2 drink(s) a day. Added CIWA protocol for increased alcohol intake per wife. 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  Check CT scan of the head without contrast, vitamin B12, TSH, RPR.  Start Depakote DR 500 twice daily for both seizure prophylaxis and agitation.  Discontinue Ativan.  Continue ongoing therapies.  Long discussion with patient's wife at the bedside and with son  over the phone about his plan of care and answered questions.  Greater than 50% time during this 25-minute visit was spent in counseling and coordination of care and discussion with care team. Antony Contras, MD To contact Stroke Continuity provider, please refer to [email or web address] After hours, contact General Neurology

## 2020-05-08 NOTE — Progress Notes (Signed)
Patient has not been voiding spontaneously since 10/12, pt has been retening urine, already done x3 bladder scan and I&O cath; still has not void, so informed oncall neurologist Dr. Katherine Roan, received order of Foley cath insertion. Inserted FC at 0650 am, pt tolerated procedure well, and will continue to monitor.

## 2020-05-08 NOTE — Procedures (Signed)
Patient Name:Samuel Lee FBP:[id number] Epilepsy Attending:Oree Hislop Barbra Sarks Referring Physician/Provider:Sharon Miachel Roux, NP Duration: 05/08/2020 1022 to 05/08/2020  1449  Patient history:69 year old male who presented with sudden onset of left-sided weakness, aphasia and slurred speech. MRI brain did not show any acute infarct. EEG to evaluate for seizure.  Level of alertness:Awake, asleep  AEDs during EEG study:None  Technical aspects: This EEG study was done with scalp electrodes positioned according to the 10-20 International system of electrode placement. Electrical activity was acquired at a sampling rate of 500Hz  and reviewed with a high frequency filter of 70Hz  and a low frequency filter of 1Hz . EEG data were recorded continuously and digitally stored.   Description: The posterior dominant rhythm consists of8-9Hz  activity of moderate voltage (25-35 uV) seen predominantly in posterior head regions, symmetric and reactive to eye opening and eye closing. EEG showed continuous generalized 3 to 6 Hz theta-delta slowing as well as generalized 13 to 15 Hz beta activity. Hyperventilation and photic stimulation were not performed.   ABNORMALITY -Continuous slow, generalized  IMPRESSION: This study issuggestive of mild diffuse encephalopathy, nonspecific etiology. No seizures or epileptiform discharges were seen throughout the recording.  Felita Bump Barbra Sarks

## 2020-05-08 NOTE — Procedures (Addendum)
Patient Name: Samuel Lee  MRN: [id number]  Epilepsy Attending: Lora Havens  Referring Physician/Provider: Burnetta Sabin, NP Duration:  05/07/2020 1022 to 05/08/2020 1022  Patient history: 69 year old male who presented with sudden onset of left-sided weakness, aphasia and slurred speech. MRI brain did not show any acute infarct. EEG to evaluate for seizure.  Level of alertness: Awake, asleep  AEDs during EEG study: None  Technical aspects: This EEG study was done with scalp electrodes positioned according to the 10-20 International system of electrode placement. Electrical activity was acquired at a sampling rate of 500Hz  and reviewed with a high frequency filter of 70Hz  and a low frequency filter of 1Hz . EEG data were recorded continuously and digitally stored.   Description: The posterior dominant rhythm consists of 8-9 Hz activity of moderate voltage (25-35 uV) seen predominantly in posterior head regions, symmetric and reactive to eye opening and eye closing. EEG showed continuous generalized 3 to 6 Hz theta-delta slowing as well as generalized 13 to 15 Hz beta activity. Hyperventilation and photic stimulation were not performed.     ABNORMALITY -Continuous slow, generalized  IMPRESSION: This study is suggestive of mild diffuse encephalopathy, nonspecific etiology. No seizures or epileptiform discharges were seen throughout the recording.  Samuel Lee

## 2020-05-08 NOTE — Progress Notes (Signed)
PT Cancellation Note  Patient Details Name: Samuel Lee MRN: [id number] DOB: [birth year]   Cancelled Treatment:    Reason Eval/Treat Not Completed: Patient at procedure or test/unavailable patient on EEG and unavailable for PT. Will continue to follow acutely and attempt to try back if time/schedule allow.    Windell Norfolk, DPT, PN1   Supplemental Physical Therapist [facility]    Pager [phone] Acute Rehab Office [phone]

## 2020-05-08 NOTE — Progress Notes (Signed)
OT Cancellation Note  Patient Details Name: Samuel Lee MRN: [id number] DOB: [birth year]   Cancelled Treatment:    Reason Eval/Treat Not Completed: Patient at procedure or test/ unavailable (EEG). OT will check back as time allows.   Gloris Manchester OTR/L Supplemental OT, Department of rehab services [phone]  Keiana Tavella R H. 05/08/2020, 10:12 AM

## 2020-05-09 LAB — BASIC METABOLIC PANEL
Anion gap: 6 (ref 5–15)
BUN: 11 mg/dL (ref 8–23)
CO2: 24 mmol/L (ref 22–32)
Calcium: 8.5 mg/dL — ABNORMAL LOW (ref 8.9–10.3)
Chloride: 107 mmol/L (ref 98–111)
Creatinine, Ser: 0.67 mg/dL (ref 0.61–1.24)
GFR, Estimated: >60 mL/min (ref >60)
Glucose, Bld: 92 mg/dL (ref 70–99)
Potassium: 3.6 mmol/L (ref 3.5–5.1)
Sodium: 137 mmol/L (ref 135–145)

## 2020-05-09 LAB — RPR: RPR Ser Ql: NONREACTIVE

## 2020-05-09 LAB — CBC
HCT: 41 % (ref 39.0–52.0)
Hemoglobin: 14.6 g/dL (ref 13.0–17.0)
MCH: 32.4 pg (ref 26.0–34.0)
MCHC: 35.6 g/dL (ref 30.0–36.0)
MCV: 90.9 fL (ref 80.0–100.0)
Platelets: 152 10*3/uL (ref 150–400)
RBC: 4.51 MIL/uL (ref 4.22–5.81)
RDW: 12.5 % (ref 11.5–15.5)
WBC: 6.9 10*3/uL (ref 4.0–10.5)
nRBC: 0 % (ref 0.0–0.2)

## 2020-05-09 NOTE — Progress Notes (Signed)
STROKE TEAM PROGRESS NOTE   INTERVAL HISTORY Wife at bedside. Son on the phone. Pt looks remarkable better. patient's memory still not clear - he doesn't remember if he walked or did not the day of admission.  EEG LTEM showed no definite seizures just gen slowing     Vitals:   05/08/20 2015 05/09/20 0016 05/09/20 0443 05/09/20 0814  BP: (!) 147/87 138/83 131/74 (!) 158/77  Pulse: 74 70 65 78  Resp: $Remo'19 20 20 20  'lzTvh$ Temp: 98.7 F (37.1 C) 98 F (36.7 C) 98 F (36.7 C) 97.6 F (36.4 C)  TempSrc: Axillary Oral Oral Oral  SpO2: 100% 96% 96% 97%  Weight:      Height:       CBC:  Recent Labs  Lab 05/05/20 1036 05/05/20 1047 05/08/20 1022 05/09/20 0107  WBC 7.6   < > 8.4 6.9  NEUTROABS 6.2  --   --   --   HGB 16.3   < > 14.9 14.6  HCT 47.7   < > 42.1 41.0  MCV 93.0   < > 91.9 90.9  PLT 154   < > 155 152   < > = values in this interval not displayed.   Basic Metabolic Panel:  Recent Labs  Lab 05/07/20 0054 05/07/20 0054 05/08/20 0442 05/09/20 0107  NA 136   < > 139 137  K 3.1*   < > 3.4* 3.6  CL 105   < > 110 107  CO2 19*   < > 20* 24  GLUCOSE 84   < > 83 92  BUN 17   < > 15 11  CREATININE 1.04   < > 0.82 0.67  CALCIUM 8.5*   < > 8.7* 8.5*  MG 2.2  --   --   --   PHOS 2.9  --   --   --    < > = values in this interval not displayed.   Lipid Panel:  Recent Labs  Lab 05/06/20 0417  CHOL 180  TRIG 47  HDL 69  CHOLHDL 2.6  VLDL 9  LDLCALC 102*   HgbA1c:  Recent Labs  Lab 05/06/20 0417  HGBA1C 4.6*   Urine Drug Screen:  Recent Labs  Lab 05/05/20 1248  LABOPIA NONE DETECTED  COCAINSCRNUR NONE DETECTED  LABBENZ NONE DETECTED  AMPHETMU NONE DETECTED  THCU NONE DETECTED  LABBARB NONE DETECTED    Alcohol Level  Recent Labs  Lab 05/05/20 1036  ETH <10    IMAGING past 24 hours CT HEAD WO CONTRAST  Result Date: 05/08/2020 CLINICAL DATA:  69 year old male status post code stroke presentation with left side weakness and slurred speech. No acute  infarct evident by MRI. EXAM: CT HEAD WITHOUT CONTRAST TECHNIQUE: Contiguous axial images were obtained from the base of the skull through the vertex without intravenous contrast. COMPARISON:  Brain MRI 05/06/2020.  CT head 05/05/2020. FINDINGS: Brain: Normal cerebral volume for age. No midline shift, ventriculomegaly, mass effect, evidence of mass lesion, intracranial hemorrhage or evidence of cortically based acute infarction. Stable gray-white matter differentiation throughout the brain. No encephalomalacia identified. Vascular: Calcified atherosclerosis at the skull base. No suspicious intracranial vascular hyperdensity. Skull: No acute osseous abnormality identified. Sinuses/Orbits: Visualized paranasal sinuses and mastoids are clear. Other: No acute orbit or scalp soft tissue finding. IMPRESSION: Stable and negative non contrast CT appearance of the brain. No acute or evolving infarct identified. Electronically Signed   By: Genevie Ann M.D.   On: 05/08/2020 18:10  PHYSICAL EXAM     Pleasant elderly Caucasian male not in distress. . Afebrile. Head is nontraumatic. Neck is supple without bruit.    Cardiac exam no murmur or gallop. Lungs are clear to auscultation. Distal pulses are well felt. Neurological Exam ;  Awake, alert  and oriented x 2. Follows commands..Mildly dysarthric speech and  follows one and two-step commands. Eye movements full without nystagmus.fundi were not visualized. Vision acuity and fields appear normal. Hearing is normal. Palatal movements are normal. Face symmetric with only subtle left nasolabial fold asymmetry when he smiles.. Tongue midline. Normal strength, tone, reflexes and coordination. Normal sensation. Gait deferred.   ASSESSMENT/PLAN [name] with history of melanoma and arthritis presenting with L sided weakness, aphasia and slurred speech. Received tPA 05/05/2020 at 1052.  Stroke-like symptoms s/p tPA Most likely Seizures  with postictal confusion w/ seizure PTA and during hospitalization  Code Stroke CT head No acute abnormality. ASPECTS 10.     CTA head & neck no LVO. Mild atherosclerosis B V4 <30%  MRI  No acute infarct  Repeat CT head today     2D Echo EF 45-50%. No source of embolus   EEG continuous slowing, generalizaed  LT EEG  Neg for sz  LDL 102  HgbA1c 4.6  Labs (folate, HIV, RPR, ESR, TSH, VB12) Unremarkable   VTE prophylaxis - changed to Lovenox 40 mg sq daily   No antithrombotic prior to admission, now on No antithrombotic. Added DAPT x 3 weeks then aspirin alone  - as not stroke/TIA - will d/c    Add depakote - load with 1gm followed by Depakote DR 500 bid  Patient much improved on depakote  Therapy recommendations:  CIR - admission coordinator is following -will have theray reassess  (he routinely walks 10 miles/day, [place]).   Disposition:  pending (lives w/ wife, retired 1 yr ago)  Hypertension post tPA  New medication terazosin PTA  Home meds:  None  BP goal normotensive . Treated w/ cleviprex, now off . Added norvasc 5 . Wife states long hx hypotension and questions need for BP meds. Will follow . Long-term BP goal normotensive  Hyperlipidemia  Home meds:  No statin  LDL 102, goal < 70  Consider statin   Dysphagia, resolved . Secondary to stroke . Cleared for diet . Speech on board   Other Stroke Risk Factors  Advanced age  ETOH use, alcohol level <10, advised to drink no more than 2 drink(s) a day. Added CIWA protocol for increased alcohol intake per wife. Received 3 doses of ativan 10/12-10/13, lethargic follow, now resolved. Pt states he only drinks about 2 a day unless there is a party or something.   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 Therapies  recommend CLR. Continue Depakote.Hopefully transfer to rehab in next few days. D/w patient, wife and son.I have spent a total of    25 minutes with the patient reviewing hospital notes,  test results, labs and examining the patient as well as establishing an assessment and plan that was discussed personally with the patient.  > 50% of time was spent in direct patient care.    Antony Contras, MD   To contact Stroke Continuity provider, please refer to [email or web address] After hours, contact  General Neurology

## 2020-05-09 NOTE — Progress Notes (Signed)
Physical Therapy Treatment Patient Details Name: Samuel Lee MRN: [id number] DOB: 10/29/1950 Today's Date: 05/09/2020    History of Present Illness Pt is a 69 yo male presenting with sudden onset L sided weakness, slurred speech, and aphasia s/p tPA. CTH and MRI negative. EEG performed and suggestive of mild diffuse encephalopathy. PMHx includes melanoma, arthritis     PT Comments    Pt making excellent progress today.  He was able to ambulate 300' with and without RW with min guard for safety.  Without AD did demonstrate mild instability but no formal loss of balance. Updated POC to home with family and HHPT and recommend use of RW initially.     Follow Up Recommendations  Home health PT;Supervision - Intermittent     Equipment Recommendations  Rolling walker with 5" wheels    Recommendations for Other Services       Precautions / Restrictions Precautions Precautions: Fall Restrictions Weight Bearing Restrictions: No    Mobility  Bed Mobility Overal bed mobility: Modified Independent Bed Mobility: Supine to Sit     Supine to sit: Modified independent (Device/Increase time)     General bed mobility comments: increased time but able to perform  Transfers Overall transfer level: Needs assistance Equipment used: Rolling walker (2 wheeled) Transfers: Sit to/from Stand Sit to Stand: Min guard Stand pivot transfers: Min guard       General transfer comment: Min guard for safety  Ambulation/Gait Ambulation/Gait assistance: Min guard Gait Distance (Feet): 300 Feet Assistive device: Rolling walker (2 wheeled);None   Gait velocity: decreased   General Gait Details: With RW - demonstrated safe reciprocal gait without LOB, see dynamic balance; Without RW - pt with mild instability and drifting R/L but no formal LOB   Stairs             Wheelchair Mobility    Modified Rankin (Stroke Patients Only)       Balance Overall balance assessment: Needs  assistance Sitting-balance support: Feet supported;No upper extremity supported Sitting balance-Samuel Lee Scale: Normal     Standing balance support: No upper extremity supported;During functional activity Standing balance-Samuel Lee Scale: Good Standing balance comment: stood at sink to brush teeth               High Level Balance Comments: With RW: pt able to navigate around objects, step over flat object, change direction, and performed cognitive task (named foods with letters of alphabet).            Cognition Arousal/Alertness: Awake/alert Behavior During Therapy: Flat affect Overall Cognitive Status: Within Functional Limits for tasks assessed                                 General Comments: Difficult to assess today. Pt. not very expressive and this could be his norm or he could still have some changes present.  He was able to complete some cognitive testing during ambulation and was able to complete tasks without cues.  Wife present    Exercises      General Comments General comments (skin integrity, edema, etc.): Pt educated on good progress and change of recommendation of home with supervision and HHPT.  Did discuss gradual progress back to routines and safe techniques to return to his walking regimen when cleared by [facility] and MD (including carrying phone, tracking apps, walking buddy)      Pertinent Vitals/Pain Pain Assessment: No/denies pain    Home Living  Prior Function            PT Goals (current goals can now be found in the care plan section) Acute Rehab PT Goals Patient Stated Goal: return home and to walking PT Goal Formulation: With patient/family Time For Goal Achievement: 05/20/20 Potential to Achieve Goals: Good Progress towards PT goals: Progressing toward goals    Frequency    Min 3X/week      PT Plan Discharge plan needs to be updated    Co-evaluation PT/OT/SLP Co-Evaluation/Treatment:  Yes Reason for Co-Treatment: For patient/therapist safety (Based on previous buckling, balance, mobility level) PT goals addressed during session: Mobility/safety with mobility;Balance OT goals addressed during session: ADL's and self-care      AM-PAC PT "6 Clicks" Mobility   Outcome Measure  Help needed turning from your back to your side while in a flat bed without using bedrails?: None Help needed moving from lying on your back to sitting on the side of a flat bed without using bedrails?: None Help needed moving to and from a bed to a chair (including a wheelchair)?: None Help needed standing up from a chair using your arms (e.g., wheelchair or bedside chair)?: A Little Help needed to walk in hospital room?: A Little Help needed climbing 3-5 steps with a railing? : A Little 6 Click Score: 21    End of Session Equipment Utilized During Treatment: Gait belt Activity Tolerance: Patient tolerated treatment well Patient left: with chair alarm set;in chair;with call bell/phone within reach;with family/visitor present Nurse Communication: Mobility status PT Visit Diagnosis: Difficulty in walking, not elsewhere classified (R26.2);Unsteadiness on feet (R26.81)     Time: 3532-9924 PT Time Calculation (min) (ACUTE ONLY): 29 min  Charges:  $Gait Training: 8-22 mins                     Abran Richard, PT Acute Rehab Services Pager [phone] [facility] [place] 05/09/2020, 2:05 PM

## 2020-05-09 NOTE — Progress Notes (Signed)
Occupational Therapy Treatment Patient Details Name: Samuel Lee MRN: [id number] DOB: 14-Aug-1950 Today's Date: 05/09/2020    History of present illness Pt is a 69 yo male presenting with sudden onset L sided weakness, slurred speech, and aphasia s/p tPA. CTH and MRI negative. EEG performed and suggestive of mild diffuse encephalopathy. PMHx includes melanoma, arthritis    OT comments  Pt. Seen for skilled PT/OT treatment session.  Pt. Able to complete bed mobility mod I.  Standing grooming tasks with not LOB noted or sequencing issues during task.  Refer to PT note for mobility details.  Wife present for session and reports great improvement and progress.  Reviewed at this time pt. Still needs someone with him during ambulation for safety.    Follow Up Recommendations       Equipment Recommendations  Tub/shower seat    Recommendations for Other Services      Precautions / Restrictions Precautions Precautions: Fall Restrictions Weight Bearing Restrictions: No       Mobility Bed Mobility Overal bed mobility: Modified Independent Bed Mobility: Supine to Sit              Transfers Overall transfer level: Needs assistance Equipment used: None;Rolling walker (2 wheeled) Transfers: Sit to/from [employer] Transfers Sit to Stand: Min guard Stand pivot transfers: Min guard            Balance                                           ADL either performed or assessed with clinical judgement   ADL Overall ADL's : Needs assistance/impaired     Grooming: Oral care;Wash/dry hands;Min guard;Standing               Lower Body Dressing: Min guard;Sitting/lateral leans Lower Body Dressing Details (indicate cue type and reason): pt. able to cross each leg over knee to reach feet for donning and adjusting socks Toilet Transfer: Designer, television/film set Details (indicate cue type and reason): simulated with in room ambulation, hallway  walking and then transfer to recliner         Functional mobility during ADLs: Min guard General ADL Comments: pt. perfomring at min guard level A.  wife present and reports great progress and improvement from previous days.  at this point PT and myself reviewed with her and the pt. that he would likely return home with [facility] due to how he was performing.  reviewed safety precuations with regards to him not ambulating in n.hood like he was previously without some type of tracking options with phone and also with someone with him initially.     Vision       Perception     Praxis      Cognition Arousal/Alertness: Awake/alert Behavior During Therapy: Flat affect                                   General Comments: difficult to assess today. pt. not very expressive and this could be his norm or he could still have some changes present.  was able to complete some cognitive testing during ambulation (see PT  note) and was able to complete tasks without cues for sequencing deficits        Exercises     Shoulder Instructions  General Comments      Pertinent Vitals/ Pain       Pain Assessment: No/denies pain  Home Living                                          Prior Functioning/Environment              Frequency  Min 2X/week        Progress Toward Goals  OT Goals(current goals can now be found in the care plan section)  Progress towards OT goals: Progressing toward goals     Plan Discharge plan needs to be updated -reviewed that [facility] will likely be option now due to how well he is progressing   Co-evaluation    PT/OT/SLP Co-Evaluation/Treatment: Yes Reason for Co-Treatment: To address functional/ADL transfers   OT goals addressed during session: ADL's and self-care      AM-PAC OT "6 Clicks" Daily Activity     Outcome Measure   Help from another person eating meals?: A Lot Help from another person taking care of  personal grooming?: A Lot Help from another person toileting, which includes using toliet, bedpan, or urinal?: A Lot Help from another person bathing (including washing, rinsing, drying)?: A Lot Help from another person to put on and taking off regular upper body clothing?: A Lot Help from another person to put on and taking off regular lower body clothing?: A Lot 6 Click Score: 12    End of Session Equipment Utilized During Treatment: Gait belt;Rolling walker  OT Visit Diagnosis: Unsteadiness on feet (R26.81);Other symptoms and signs involving cognitive function   Activity Tolerance Patient tolerated treatment well   Patient Left in chair;with call bell/phone within reach;with chair alarm set;with family/visitor present   Nurse Communication          Time: 5038-8828 OT Time Calculation (min): 29 min  Charges: OT General Charges $OT Visit: 1 Visit OT Treatments $Self Care/Home Management : 8-22 mins  Samuel Lee, [place]   Janice Coffin 05/09/2020, 12:40 PM

## 2020-05-10 DIAGNOSIS — R569 Unspecified convulsions: Principal | ICD-10-CM

## 2020-05-10 DIAGNOSIS — R338 Other retention of urine: Secondary | ICD-10-CM

## 2020-05-10 DIAGNOSIS — E876 Hypokalemia: Secondary | ICD-10-CM

## 2020-05-10 DIAGNOSIS — Z9282 Status post administration of tPA (rtPA) in a different facility within the last 24 hours prior to admission to current facility: Secondary | ICD-10-CM

## 2020-05-10 LAB — BASIC METABOLIC PANEL
Anion gap: 9 (ref 5–15)
BUN: 14 mg/dL (ref 8–23)
CO2: 26 mmol/L (ref 22–32)
Calcium: 8.7 mg/dL — ABNORMAL LOW (ref 8.9–10.3)
Chloride: 105 mmol/L (ref 98–111)
Creatinine, Ser: 0.69 mg/dL (ref 0.61–1.24)
GFR, Estimated: >60 mL/min (ref >60)
Glucose, Bld: 96 mg/dL (ref 70–99)
Potassium: 3 mmol/L — ABNORMAL LOW (ref 3.5–5.1)
Sodium: 140 mmol/L (ref 135–145)

## 2020-05-10 LAB — CBC
HCT: 39.8 % (ref 39.0–52.0)
Hemoglobin: 14.3 g/dL (ref 13.0–17.0)
MCH: 32.7 pg (ref 26.0–34.0)
MCHC: 35.9 g/dL (ref 30.0–36.0)
MCV: 91.1 fL (ref 80.0–100.0)
Platelets: 161 10*3/uL (ref 150–400)
RBC: 4.37 MIL/uL (ref 4.22–5.81)
RDW: 12.1 % (ref 11.5–15.5)
WBC: 6.8 10*3/uL (ref 4.0–10.5)
nRBC: 0 % (ref 0.0–0.2)

## 2020-05-10 LAB — VALPROIC ACID LEVEL: Valproic Acid Lvl: 52 ug/mL (ref 50.0–100.0)

## 2020-05-10 MED ORDER — POTASSIUM CHLORIDE CRYS ER 20 MEQ PO TBCR
40.0000 meq | EXTENDED_RELEASE_TABLET | Freq: Once | ORAL | Status: AC
  Administered 2020-05-10: 40 meq via ORAL
  Filled 2020-05-10: qty 2

## 2020-05-10 MED ORDER — ATORVASTATIN CALCIUM 10 MG PO TABS
20.0000 mg | ORAL_TABLET | Freq: Every day | ORAL | Status: DC
  Administered 2020-05-10 – 2020-05-11 (×2): 20 mg via ORAL
  Filled 2020-05-10 (×2): qty 2

## 2020-05-10 NOTE — Progress Notes (Signed)
STROKE TEAM PROGRESS NOTE   INTERVAL HISTORY Wife at bedside. Pt lying in bed, pleasant, AAO x 3. PT/OT recommend [facility] PT/OT. Pt wants to go home, however, still has foley catheter.  Will d/c foley and watch for void trial. Depakote level 52, discussed with pt and wife, wife requested to continue depakote current dose.    Vitals:   05/09/20 1536 05/09/20 2013 05/09/20 2345 05/10/20 0431  BP: 136/66 (!) 145/92 136/84 132/80  Pulse: 75 70 71 66  Resp: _0 Temp: 98.2 F (36.8 C) 97.6 F (36.4 C) 98.9 F (37.2 C) 98.6 F (37 C)  TempSrc: Oral Oral  Axillary  SpO2: 97% 99% 98% 96%  Weight:      Height:       CBC:  Recent Labs  Lab 05/05/20 1036 05/05/20 1047 05/09/20 0107 05/10/20 0349  WBC 7.6   < > 6.9 6.8  NEUTROABS 6.2  --   --   --   HGB 16.3   < > 14.6 14.3  HCT 47.7   < > 41.0 39.8  MCV 93.0   < > 90.9 91.1  PLT 154   < > 152 161   < > = values in this interval not displayed.   Basic Metabolic Panel:  Recent Labs  Lab 05/07/20 0054 05/08/20 0442 05/09/20 0107 05/10/20 0349  NA 136   < > 137 140  K 3.1*   < > 3.6 3.0*  CL 105   < > 107 105  CO2 19*   < > 24 26  GLUCOSE 84   < > 92 96  BUN 17   < > 11 14  CREATININE 1.04   < > 0.67 0.69  CALCIUM 8.5*   < > 8.5* 8.7*  MG 2.2  --   --   --   PHOS 2.9  --   --   --    < > = values in this interval not displayed.   Lipid Panel:  Recent Labs  Lab 05/06/20 0417  CHOL 180  TRIG 47  HDL 69  CHOLHDL 2.6  VLDL 9  LDLCALC 102*   HgbA1c:  Recent Labs  Lab 05/06/20 0417  HGBA1C 4.6*   Urine Drug Screen:  Recent Labs  Lab 05/05/20 1248  LABOPIA NONE DETECTED  COCAINSCRNUR NONE DETECTED  LABBENZ NONE DETECTED  AMPHETMU NONE DETECTED  THCU NONE DETECTED  LABBARB NONE DETECTED    Alcohol Level  Recent Labs  Lab 05/05/20 1036  ETH <10    IMAGING past 24 hours No results found.  PHYSICAL EXAM     Pleasant elderly Caucasian male not in distress. Afebrile. Head is nontraumatic. Neck is  supple without bruit.    Cardiac exam no murmur or gallop. Lungs are clear to auscultation. Distal pulses are well felt.  Neurological Exam ;  Awake, alert  and oriented x 3. No aphasia, able to name and repeat but mildly dysarthric speech, able to  follow one and two-step commands. 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 Fundi were not visualized. Vision fields intact. Face symmetric. Tongue midline. Normal strength, tone, reflexes and coordination. Normal sensation. Gait deferred.   ASSESSMENT/PLAN [name] with history of melanoma and arthritis presenting with L sided weakness, aphasia and slurred speech. Received tPA 05/05/2020 at 1052.  Stroke-like symptoms s/p tPA Most likely Seizures with postictal confusion, given possible seizure  like activities PTA and during hospitalization  CT head No acute abnormality. ASPECTS 10.     CTA head & neck no LVO. Mild atherosclerosis B V4 <30%  MRI  No acute infarct  Repeat CT head today no acute finding      2D Echo EF 45-50%. No source of embolus   EEG general slowing   LT EEG  Neg for sz  LDL 102  HgbA1c 4.6  Labs (folate, HIV, RPR, ESR, TSH, VB12) Unremarkable   VTE prophylaxis - Lovenox 40 mg sq daily   No antithrombotic prior to admission, now on No antithrombotic. Added DAPT x 3 weeks then aspirin alone  - as not stroke/TIA - will d/c    Add depakote - load with 1gm followed by Depakote DR 500 bid  Patient much improved on depakote  Therapy recommendations:  HH PT/OT  Disposition:  Pending   ?? Seizure  PTA - was acting different and had trouble speaking and slurred speech and slightly confused. EMS called  10/13 - Patient with dried blood around mouth in inside mouth. Confused.  Long-term EEG monitoring showed mild diffuse generalized slowing and no definite epileptiform activity.  On depakote 576m bid  depakote level 52  Will  continue depakote on discharge - [facility]   On terazosin PTA  BP goal normotensive . Treated w/ cleviprex, now off . Now on terazonsin 10 Qhs and norvasc 5 . Long-term BP goal normotensive  Hyperlipidemia  Home meds:  No statin  LDL 102, goal < 70  Add lipitor 20  Urinary retention   BPH, recently started on terazosin  Has foley catheter  For d/c preparation, d/c foley  Void trials  Bladder scan Q6h  I&O if bladder scan > 500cc   Other Stroke Risk Factors  Advanced age  ETOH use, alcohol level <10, advised to drink no more than 2 drink(s) a day. Added CIWA protocol for increased alcohol intake per wife. Received 3 doses of ativan 10/12-10/13, lethargic follow, now resolved. Pt states he only drinks about 2 a day unless there is a party or something.   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 They expressed understanding and appreciation.   JRosalin Hawking MD PhD Stroke Neurology 05/10/2020 3:43 PM        To contact Stroke Continuity provider, please refer to [email or web address] After hours, contact General Neurology

## 2020-05-10 NOTE — Progress Notes (Signed)
Foley cath. Discontinued for voiding trial; will monitor and notify MD for discharge orders home.

## 2020-05-10 NOTE — Discharge Summary (Addendum)
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 Arthritis   . Cancer ([place])    melanoma   Past Surgical History:  Procedure Laterality Date  . HIP SURGERY  03/2010   bilaterally hip  . INGUINAL HERNIA REPAIR Right 04/09/2015   Procedure: RIGHT INGUINAL HERNIA REPAIR WITH MESH;  Surgeon: Erroll Luna, MD;  Location: [facility];  Service: General;  Laterality: Right;  . INSERTION OF MESH Right 04/09/2015   Procedure: INSERTION OF MESH;  Surgeon: Erroll Luna, MD;  Location: [facility];  Service: General;  Laterality: Right;  . MELANOMA EXCISION  09/2010   nose    Family History Family History  Problem Relation Age of Onset  . Hypertension Mother   . Hypertension Father     Social History  reports that he has never smoked. He has never used smokeless tobacco. He reports current alcohol use. He reports that he does not use drugs.  Allergies as of 05/11/2020   No Known Allergies     Medication List    STOP taking these medications   Advil PM 200-25 MG Caps Generic drug: Ibuprofen-diphenhydrAMINE HCl   naproxen sodium 220 MG tablet Commonly known as: ALEVE     TAKE these medications   Advil PM 200-38 MG Tabs Generic drug: Ibuprofen-diphenhydrAMINE Cit Take 2 tablets by mouth at bedtime.   amLODipine 5 MG tablet Commonly known as: NORVASC Take 1 tablet (5 mg total) by mouth daily. Start taking on: May 12, 2020   atorvastatin 20 MG tablet Commonly known as:  LIPITOR Take 1 tablet (20 mg total) by mouth daily. Start taking on: May 12, 2020   divalproex 500 MG DR tablet Commonly known as: DEPAKOTE Take 1 tablet (500 mg total) by mouth every 12 (twelve) hours.   folic acid 1 MG tablet Commonly known as: FOLVITE Take 1 tablet (1 mg total) by mouth daily. Start taking on: May 12, 2020   multivitamin with minerals Tabs tablet Take 1 tablet by mouth daily. Start taking on: May 12, 2020   terazosin 1 MG capsule Commonly known as: HYTRIN Take 1 mg by mouth at bedtime.   thiamine 100 MG tablet Take 1 tablet (100 mg total) by mouth daily. Start taking on: May 12, 2020            Durable Medical Equipment  (From admission, onward)         Start     Ordered   05/10/20 1155  For home use only DME Walker rolling  Once       Question Answer Comment  Walker: With 5 Inch Wheels   Patient needs a walker to treat with the following condition Weakness      05/10/20 [place] Medications Prior to Admission  Medication Sig Dispense Refill  . Ibuprofen-diphenhydrAMINE Cit (ADVIL PM) 200-38 MG TABS Take 2 tablets by mouth  at bedtime.    Marland Kitchen terazosin (HYTRIN) 1 MG capsule Take 1 mg by mouth at bedtime.    . Ibuprofen-Diphenhydramine HCl (ADVIL PM) 200-25 MG CAPS Take by mouth. (Patient not taking: Reported on 05/05/2020)    . naproxen sodium (ANAPROX) 220 MG tablet Take 220 mg by mouth 2 (two) times daily with a meal. (Patient not taking: Reported on 05/05/2020)       HOSPITAL MEDICATIONS .  stroke: mapping our early stages of recovery book   Does not apply Once  . amLODipine  5 mg Oral Daily  . atorvastatin  20 mg Oral Daily  . Chlorhexidine Gluconate Cloth  6 each Topical Daily  . divalproex  500 mg Oral Q12H  . enoxaparin (LOVENOX) injection  40 mg Subcutaneous Q24H  . folic acid  1 mg Oral Daily  . multivitamin with minerals  1 tablet Oral Daily  . pantoprazole  40 mg Oral  QHS  . terazosin  1 mg Oral QHS  . thiamine  100 mg Oral Daily   Or  . thiamine  100 mg Intravenous Daily    LABORATORY STUDIES CBC    Component Value Date/Time   WBC 6.4 05/11/2020 0302   RBC 4.56 05/11/2020 0302   HGB 14.5 05/11/2020 0302   HCT 41.2 05/11/2020 0302   PLT 164 05/11/2020 0302   MCV 90.4 05/11/2020 0302   MCH 31.8 05/11/2020 0302   MCHC 35.2 05/11/2020 0302   RDW 12.0 05/11/2020 0302   LYMPHSABS 1.0 05/05/2020 1036   MONOABS 0.3 05/05/2020 1036   EOSABS 0.0 05/05/2020 1036   BASOSABS 0.0 05/05/2020 1036   CMP    Component Value Date/Time   NA 142 05/11/2020 0302   K 3.7 05/11/2020 0302   CL 104 05/11/2020 0302   CO2 29 05/11/2020 0302   GLUCOSE 100 (H) 05/11/2020 0302   BUN 12 05/11/2020 0302   CREATININE 0.80 05/11/2020 0302   CALCIUM 9.0 05/11/2020 0302   PROT 6.2 (L) 05/07/2020 0054   ALBUMIN 4.2 05/07/2020 0054   AST 25 05/07/2020 0054   ALT 18 05/07/2020 0054   ALKPHOS 39 05/07/2020 0054   BILITOT 1.4 (H) 05/07/2020 0054   GFRNONAA >60 05/11/2020 0302   GFRAA  04/05/2007 0435    >60        The eGFR has been calculated using the MDRD equation. This calculation has not been validated in all clinical   COAGS Lab Results  Component Value Date   INR 1.2 05/05/2020   INR 2.2 (H) 04/06/2007   INR 2.7 (H) 04/05/2007   Lipid Panel    Component Value Date/Time   CHOL 180 05/06/2020 0417   TRIG 47 05/06/2020 0417   HDL 69 05/06/2020 0417   CHOLHDL 2.6 05/06/2020 0417   VLDL 9 05/06/2020 0417   LDLCALC 102 (H) 05/06/2020 0417   HgbA1C  Lab Results  Component Value Date   HGBA1C 4.6 (L) 05/06/2020   Urinalysis    Component Value Date/Time   COLORURINE YELLOW 05/05/2020 1248   APPEARANCEUR CLEAR 05/05/2020 1248   LABSPEC 1.038 (H) 05/05/2020 1248   PHURINE 6.0 05/05/2020 1248   GLUCOSEU NEGATIVE 05/05/2020 1248   HGBUR NEGATIVE 05/05/2020 [place] 05/05/2020 1248   KETONESUR NEGATIVE 05/05/2020 1248    PROTEINUR NEGATIVE 05/05/2020 1248   UROBILINOGEN 1.0 03/28/2007 1100   NITRITE NEGATIVE 05/05/2020 1248   LEUKOCYTESUR NEGATIVE 05/05/2020 1248   Urine Drug Screen     Component Value Date/Time  LABOPIA NONE DETECTED 05/05/2020 1248   COCAINSCRNUR NONE DETECTED 05/05/2020 1248   LABBENZ NONE DETECTED 05/05/2020 1248   AMPHETMU NONE DETECTED 05/05/2020 1248   THCU NONE DETECTED 05/05/2020 1248   LABBARB NONE DETECTED 05/05/2020 1248    Alcohol Level    Component Value Date/Time   ETH <10 05/05/2020 1036     SIGNIFICANT DIAGNOSTIC STUDIES  CT HEAD WO CONTRAST 05/08/2020 IMPRESSION:  Stable and negative non contrast CT appearance of the brain. No acute or evolving infarct identified.   MR BRAIN WO CONTRAST 05/06/2020 IMPRESSION:  Motion degraded exam. Diffusion imaging is of good quality. No sign of acute or subacute infarction. No sign of widespread small-vessel disease.    EEG adult 05/06/2020 ABNORMALITY - Continuous slow, generalized  IMPRESSION:  This study is suggestive of mild diffuse encephalopathy, nonspecific etiology no seizures or epileptiform discharges were seen throughout the recording.   EEG LTVM -  Continuous Bedside W/ Video Includes Portable EEG Read 05/08/2020 ABNORMALITY - Continuous slow, generalized   IMPRESSION:  This study is suggestive of mild diffuse encephalopathy, nonspecific etiology. No seizures or epileptiform discharges were seen throughout the recording.   ECHOCARDIOGRAM COMPLETE 05/05/2020 IMPRESSIONS   1. Left ventricular ejection fraction, by estimation, is 45 to 50%. The left ventricle has mildly decreased function. The left ventricle demonstrates regional wall motion abnormalities (see scoring diagram/findings for description). Left ventricular diastolic parameters were normal. There is mild hypokinesis of the left ventricular, mid-apical inferolateral wall.   2. Right ventricular systolic function is normal. The right  ventricular size is normal.   3. The mitral valve is normal in structure. Trivial mitral valve regurgitation. No evidence of mitral stenosis.   4. The aortic valve is grossly normal. Aortic valve regurgitation is mild. No aortic stenosis is present.   CT HEAD CODE STROKE WO CONTRAST 05/05/2020 IMPRESSION:  1. No acute intracranial abnormality.  2. ASPECTS is 10   CT ANGIO HEAD CODE STROKE CT ANGIO NECK CODE STROKE 05/05/2020 IMPRESSION:  1. No large or medium vessel occlusion.  2. No significant carotid bifurcation disease.  3. Mild atherosclerotic disease in both vertebral V4 segments but without stenosis greater than 30%.    HISTORY OF PRESENT ILLNESS (from H&P by Donnetta Simpers MD 05/05/20) Samuel Lee is a 69 y.o. male with history of melanoma and arthritis.  Patient, per wife awoke at 930 and he was at his baseline.  Wife went to go do something in the house and when returned she noted that he could not get up out of a chair secondary to left-sided weakness.  She also noted left-sided facial droop and slurred speech.  EMS was called.  Per wife patient at baseline is completely normal, walks without a problem and never had any weakness.  On arrival he was noted to have a left facial droop, slurred speech, left arm and leg weakness.  Blood pressure was 181/100, EKG showed normal sinus rhythm.  On arrival patient appeared to have some improvement with his left-sided weakness.  He continued to have slurred speech.  He had no risk factors for TPA.  tPA was administered. 30 M p/w sudden onset L sided weakness, aphasia + slurred speech. Strength improved but persistent aphasia to some objects and significant slurring of speech with left lower facial droop. CTH with no ICH, CTA with no LVO. SBP in 150s. Plan: tPA given and post tPA checks, no aspirin, no sq heparin. Repeat CTH in 24 hours. This patient is critically ill and at  significant risk of neurological worsening, death and care  requires constant monitoring of vital signs, hemodynamics,respiratory and cardiac monitoring, neurological assessment, discussion with family, other specialists and medical decision making of high complexity. I spent 45 minutes of neurocritical care time  in the care of  this patient. This was time spent independent of any time provided by nurse practitioner or PA. LKW: 9:30 AM tpa given?:  Yes Premorbid modified Rankin scale (mRS): 0 NIHSS: 6  HOSPITAL COURSE [name] with history of melanoma and arthritis presenting with L sided weakness, aphasia and slurred speech. Received tPA 05/05/2020 at 1052.  Stroke-like symptoms s/p tPA Most likely Seizures with postictal confusion, given possible seizure like activities PTA and during hospitalization  CT head No acute abnormality. ASPECTS 10.     CTA head & neck no LVO. Mild atherosclerosis B V4 <30%  MRI  No acute infarct  Repeat CT head today no acute finding      2D Echo EF 45-50%. No source of embolus   EEG general slowing   LT EEG  Neg for sz  LDL 102  HgbA1c 4.6  Labs (folate, HIV, RPR, ESR, TSH, VB12) Unremarkable   VTE prophylaxis - Lovenox 40 mg sq daily   No antithrombotic prior to admission, now on No antithrombotic. Added DAPT x 3 weeks then aspirin alone  - as not stroke/TIA - will d/c    Add depakote - load with 1gm followed by Depakote DR 500 bid  Patient much improved on depakote  Therapy recommendations:  HH PT/OT  Disposition:  Pending   ?? Seizure  PTA - was acting different and had trouble speaking and slurred speech and slightly confused. EMS called  10/13 - Patient with dried blood around mouth in inside mouth. Confused.  Long-term EEG monitoring showed mild diffuse generalized slowing and no definite epileptiform activity.  On depakote 561m bid  depakote level 52  Will continue depakote on discharge - [facility]    On terazosin PTA  BP goal normotensive  Treated w/ cleviprex, now off  Now on terazonsin 10 Qhs and norvasc 5  Long-term BP goal normotensive  Hyperlipidemia  Home meds:  No statin  LDL 102, goal < 70  Add lipitor 20  Urinary retention   BPH, recently started on terazosin  Has foley catheter  D/c foley catheter -> voiding trial-> large residuals -> placed foley again - arranged outpatient follow up with [facility] - they will call him to schedule an appointment.    Other Stroke Risk Factors  Advanced age  ETOH use, alcohol level <10, advised to drink no more than 2 drink(s) a day. Added CIWA protocol for increased alcohol intake per wife. Received 3 doses of ativan 10/12-10/13, lethargic follow, now resolved. Pt states he only drinks about 2 a day unless there is a party or something.   Other Active Problems  Melanoma on nose, excised   Hypokalemia 3.1 -> 3.4->3.6 - supplement - resolved   DISCHARGE EXAM Vitals:   05/10/20 2214 05/11/20 0014 05/11/20 0429 05/11/20 0852  BP: 140/89 (!) 141/81 (!) 142/81 (!) 146/87  Pulse: 77 74 65 73  Resp: '16 19 18 18  ' Temp: 97.7 F (36.5 C) 98.2 F (36.8 C) 98.1 F (36.7 C) 98.6 F (37 C)  TempSrc: Oral Oral Oral   SpO2: 99% 95% 99% 94%  Weight:      Height:  PHYSICAL EXAM    Pleasant elderly Caucasian male not in distress. Afebrile. Head is nontraumatic. Neck is supple without bruit.    Cardiac exam no murmur or gallop. Lungs are clear to auscultation. Distal pulses are well felt.  Neurological Exam Awake, alert  and oriented x 3. No aphasia, able to name and repeat but mildly dysarthric speech, able to  follow one and two-step commands. 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 Fundi were not visualized. Vision fields intact. Face symmetric. Tongue midline. Normal strength, tone, reflexes and coordination. Normal sensation. Gait deferred.  DISCHARGE  INSTRUCTIONS GIVEN TO THE PATIENT 1. Have your potassium level checked at your next office visit with Dr Ernie Hew 2. The Foley catheter will stay in place until you see the urologist. They will call you to set up an appointment.  3. If you don't hear from urology by Wednesday call [facility] [phone]. They are located at [place] in [place] (near [facility])  4. Do not drive or operate power tools until you follow up with Dr Leonie Man.  DISCHARGE DIET   Diet Order            DIET DYS 3 Room service appropriate? Yes with Assist; Fluid consistency: Thin  Diet effective now                 DISCHARGE PLAN  Disposition:  Discharge to home with [facility] PT and OT  No antithrombotic for secondary stroke prevention - episode believed to be seizure  Ongoing risk factor control by Primary Care Physician at time of discharge  Follow-up Fanny Bien, MD in 1 - 2 weeks.  Follow-up in [facility] Dr. Leonie Man in 4 weeks, office to schedule an appointment  40 minutes were spent preparing discharge.  Rosalin Hawking, MD PhD Stroke Neurology 05/11/2020 2:42 PM

## 2020-05-10 NOTE — Progress Notes (Signed)
Patient voided 50 ml at 1430; bladder scan was 374ml;  Patient voided 200 ml at 1530; then 50 ml at 1545; bladder scan was 638ml; in and out cath to relieve the bladder; 900 ml obtained; Dr. Erlinda Hong notified; continue with in and out cath; voiding trial overnight per MD.

## 2020-05-10 NOTE — TOC Transition Note (Signed)
Transition of Care [facility]) - CM/SW Discharge Note   Patient Details  Name: Samuel Lee MRN: [id number] Date of Birth: Jun 11, 1951  Transition of Care [facility]) CM/SW Contact:  Carles Collet, RN Phone Number: 05/10/2020, 11:55 AM   Clinical Narrative:    Damaris Schooner w wife. She states she ordered shower chair and it will be delivered tomorrow. She would a RW not a rollator. Order placed and requested to be delivered to room today through [facility]. Margaret services set up w [facility].     Final next level of care: [place] Barriers to Discharge: No Barriers Identified   Patient Goals and CMS Choice Patient states their goals for this hospitalization and ongoing recovery are:: to go home CMS Medicare.gov Compare Post Acute Care list provided to:: Patient Choice offered to / list presented to : Patient  Discharge Placement                       Discharge Plan and Services                DME Arranged: Walker rolling DME Agency: [employer] Date DME Agency Contacted: 05/10/20 Time DME Agency Contacted: 43   [place] Arranged: PT, OT HH Agency: [facility] Date [place]: 05/10/20 Time [place]: [phone] Representative spoke with at [place]: Amy  Social Determinants of Health ([place]) Interventions     Readmission Risk Interventions No flowsheet data found.

## 2020-05-11 LAB — CBC
HCT: 41.2 % (ref 39.0–52.0)
Hemoglobin: 14.5 g/dL (ref 13.0–17.0)
MCH: 31.8 pg (ref 26.0–34.0)
MCHC: 35.2 g/dL (ref 30.0–36.0)
MCV: 90.4 fL (ref 80.0–100.0)
Platelets: 164 10*3/uL (ref 150–400)
RBC: 4.56 MIL/uL (ref 4.22–5.81)
RDW: 12 % (ref 11.5–15.5)
WBC: 6.4 10*3/uL (ref 4.0–10.5)
nRBC: 0 % (ref 0.0–0.2)

## 2020-05-11 LAB — BASIC METABOLIC PANEL
Anion gap: 9 (ref 5–15)
BUN: 12 mg/dL (ref 8–23)
CO2: 29 mmol/L (ref 22–32)
Calcium: 9 mg/dL (ref 8.9–10.3)
Chloride: 104 mmol/L (ref 98–111)
Creatinine, Ser: 0.8 mg/dL (ref 0.61–1.24)
GFR, Estimated: >60 mL/min (ref >60)
Glucose, Bld: 100 mg/dL — ABNORMAL HIGH (ref 70–99)
Potassium: 3.7 mmol/L (ref 3.5–5.1)
Sodium: 142 mmol/L (ref 135–145)

## 2020-05-11 MED ORDER — DIVALPROEX SODIUM 500 MG PO DR TAB: 500.0000 mg | DELAYED_RELEASE_TABLET | Freq: Two times a day (BID) | ORAL | 1 refills | Status: DC

## 2020-05-11 MED ORDER — FOLIC ACID 1 MG PO TABS: 1.0000 mg | ORAL_TABLET | Freq: Every day | ORAL | 1 refills | Status: DC

## 2020-05-11 MED ORDER — ATORVASTATIN CALCIUM 20 MG PO TABS: 20.0000 mg | ORAL_TABLET | Freq: Every day | ORAL | 1 refills | Status: DC

## 2020-05-11 MED ORDER — THIAMINE HCL 100 MG PO TABS: 100.0000 mg | ORAL_TABLET | Freq: Every day | ORAL | 1 refills | Status: DC

## 2020-05-11 MED ORDER — AMLODIPINE BESYLATE 5 MG PO TABS: 5.0000 mg | ORAL_TABLET | Freq: Every day | ORAL | 1 refills | Status: DC

## 2020-05-11 MED ORDER — ADULT MULTIVITAMIN W/MINERALS CH: 1.0000 | ORAL_TABLET | Freq: Every day | ORAL | Status: AC

## 2020-05-11 NOTE — Discharge Instructions (Signed)
1. Have your potassium level checked at your next office visit with Dr Ernie Hew   2. The Foley catheter will stay in place until you see the urologist. They will call you to set up an appointment.   3. If you haven't heard from urology by Wednesday call [facility] [phone]. They are located at [place] in [place] (near [facility])  4. Do not drive or operate power tools until you follow up with Dr Leonie Man.

## 2020-05-11 NOTE — Progress Notes (Signed)
Pt's right hand peripheral IV leaked when flushed with normal saline this morning at 1030. Upon removal of the IV at 1045, there was a small amount of blood and green drainage from the IV site. The IV site appeared slightly swollen and red.   The site was assessed by another RN and the attending doctor with no concerns of infection at this time. Pt's vital signs are stable. Patient denies pain on and around the IV site. Pressure was applied to the site and was covered with sterile gauze and tape. Pt was educated to keep the site clean and monitor for new drainage.

## 2020-05-11 NOTE — Care Management (Signed)
Notified [facility] Liaison of DC.

## 2020-05-11 NOTE — Progress Notes (Signed)
Physical Therapy Treatment Patient Details Name: Samuel Lee MRN: [id number] DOB: 08-Apr-1951 Today's Date: 05/11/2020    History of Present Illness Pt is a 69 yo male presenting with sudden onset L sided weakness, slurred speech, and aphasia s/p tPA. CTH and MRI negative. EEG performed and suggestive of mild diffuse encephalopathy. PMHx includes melanoma, arthritis     PT Comments    Patient progressing well towards PT goals. Tolerated gait training without use of DME with Min guard assist for balance/safety. Tolerated higher level balance challenges- walking backwards, stepping over objects, head turns, changes in gait speed etc with only mild deviations in gait but no overt LOB. Tolerated stair training without difficulty. Eager to d/c home today with support of wife. Will follow.    Follow Up Recommendations  Supervision - Intermittent;Home health PT     Equipment Recommendations  None recommended by PT    Recommendations for Other Services       Precautions / Restrictions Precautions Precautions: Fall Restrictions Weight Bearing Restrictions: No    Mobility  Bed Mobility Overal bed mobility: Modified Independent;Needs Assistance Bed Mobility: Supine to Sit;Sit to Supine     Supine to sit: Modified independent (Device/Increase time);HOB elevated Sit to supine: Modified independent (Device/Increase time);HOB elevated   General bed mobility comments: increased time but able to perform without assist.  Transfers Overall transfer level: Needs assistance Equipment used: None Transfers: Sit to/from Stand Sit to Stand: Supervision         General transfer comment: supervision for safety. Stood from [employer].  Ambulation/Gait Ambulation/Gait assistance: Min guard Gait Distance (Feet): 320 Feet Assistive device: None Gait Pattern/deviations: Step-through pattern;Decreased stride length;Trunk flexed Gait velocity: decreased   General Gait Details: Slow, mostly  steady gait with mild drifting at times; tolerated balance challenges with mild deviations in gait but no overt LOB. See balance section for details. Prefers to walk without RW.   Stairs Stairs: Yes Stairs assistance: Supervision Stair Management: Two rails Number of Stairs: 5 (x2 bouts) General stair comments: Cues for safety/technique.   Wheelchair Mobility    Modified Rankin (Stroke Patients Only)       Balance Overall balance assessment: Needs assistance Sitting-balance support: Feet supported;No upper extremity supported Sitting balance-Leahy Scale: Normal     Standing balance support: During functional activity Standing balance-Leahy Scale: Good               High level balance activites: Backward walking;Direction changes;Turns;Sudden stops;Head turns High Level Balance Comments: Tolerated above with only mild deviations in gait but no overt LOB. Able to walk backwards, step over objects, perform head turns etc.            Cognition Arousal/Alertness: Awake/alert Behavior During Therapy: Flat affect Overall Cognitive Status: Within Functional Limits for tasks assessed                                        Exercises      General Comments General comments (skin integrity, edema, etc.): Wife present in room during session.      Pertinent Vitals/Pain Pain Assessment: No/denies pain    Home Living                      Prior Function            PT Goals (current goals can now be found in the care plan section) Progress  towards PT goals: Progressing toward goals    Frequency    Min 3X/week      PT Plan Current plan remains appropriate    Co-evaluation              AM-PAC PT "6 Clicks" Mobility   Outcome Measure  Help needed turning from your back to your side while in a flat bed without using bedrails?: None Help needed moving from lying on your back to sitting on the side of a flat bed without using  bedrails?: None Help needed moving to and from a bed to a chair (including a wheelchair)?: None Help needed standing up from a chair using your arms (e.g., wheelchair or bedside chair)?: A Little Help needed to walk in hospital room?: A Little Help needed climbing 3-5 steps with a railing? : A Little 6 Click Score: 21    End of Session Equipment Utilized During Treatment: Gait belt Activity Tolerance: Patient tolerated treatment well Patient left: in bed;with call bell/phone within reach;with bed alarm set;with family/visitor present Nurse Communication: Mobility status PT Visit Diagnosis: Difficulty in walking, not elsewhere classified (R26.2);Unsteadiness on feet (R26.81)     Time: 1205-1219 PT Time Calculation (min) (ACUTE ONLY): 14 min  Charges:  $Neuromuscular Re-education: 8-22 mins                     Marisa Severin, PT, DPT Acute Rehabilitation Services Pager [phone] Office [phone]       Marguarite Arbour A Sabra Heck 05/11/2020, 1:27 PM

## 2020-05-11 NOTE — Progress Notes (Signed)
Patient continues to have large residual urine volumes after voiding; foley cath. Placed using sterile tech. Per md orders; patient tolerated without distress; clear yellow urine returned; denies pain or tenderness to his bladder.

## 2020-05-13 DIAGNOSIS — R030 Elevated blood-pressure reading, without diagnosis of hypertension: Secondary | ICD-10-CM | POA: Diagnosis not present

## 2020-05-13 NOTE — Progress Notes (Signed)
Late entry for missed G-code. Score based on review of medical record.   05/11/20 1300  Modified Rankin (Stroke Patients Only)  Pre-Morbid Rankin Score 0  Modified Rankin [place], [place]   Acute Rehabilitation Services  Pager [phone] Office [phone] 05/13/2020

## 2020-05-14 DIAGNOSIS — I10 Essential (primary) hypertension: Secondary | ICD-10-CM | POA: Diagnosis not present

## 2020-05-14 DIAGNOSIS — R569 Unspecified convulsions: Secondary | ICD-10-CM | POA: Diagnosis not present

## 2020-05-14 DIAGNOSIS — F10239 Alcohol dependence with withdrawal, unspecified: Secondary | ICD-10-CM | POA: Diagnosis not present

## 2020-05-14 DIAGNOSIS — R339 Retention of urine, unspecified: Secondary | ICD-10-CM | POA: Diagnosis not present

## 2020-05-15 DIAGNOSIS — R339 Retention of urine, unspecified: Secondary | ICD-10-CM | POA: Diagnosis not present

## 2020-05-28 DIAGNOSIS — Z85820 Personal history of malignant melanoma of skin: Secondary | ICD-10-CM | POA: Diagnosis not present

## 2020-05-28 DIAGNOSIS — Z86018 Personal history of other benign neoplasm: Secondary | ICD-10-CM | POA: Diagnosis not present

## 2020-05-28 DIAGNOSIS — L578 Other skin changes due to chronic exposure to nonionizing radiation: Secondary | ICD-10-CM | POA: Diagnosis not present

## 2020-05-28 DIAGNOSIS — Z85828 Personal history of other malignant neoplasm of skin: Secondary | ICD-10-CM | POA: Diagnosis not present

## 2020-05-28 DIAGNOSIS — L821 Other seborrheic keratosis: Secondary | ICD-10-CM | POA: Diagnosis not present

## 2020-05-28 DIAGNOSIS — D225 Melanocytic nevi of trunk: Secondary | ICD-10-CM | POA: Diagnosis not present

## 2020-05-28 DIAGNOSIS — L814 Other melanin hyperpigmentation: Secondary | ICD-10-CM | POA: Diagnosis not present

## 2020-06-09 DIAGNOSIS — Z1159 Encounter for screening for other viral diseases: Secondary | ICD-10-CM | POA: Diagnosis not present

## 2020-06-09 DIAGNOSIS — Z0000 Encounter for general adult medical examination without abnormal findings: Secondary | ICD-10-CM | POA: Diagnosis not present

## 2020-06-09 DIAGNOSIS — Z125 Encounter for screening for malignant neoplasm of prostate: Secondary | ICD-10-CM | POA: Diagnosis not present

## 2020-06-10 DIAGNOSIS — R339 Retention of urine, unspecified: Secondary | ICD-10-CM | POA: Diagnosis not present

## 2020-06-11 DIAGNOSIS — Z1339 Encounter for screening examination for other mental health and behavioral disorders: Secondary | ICD-10-CM | POA: Diagnosis not present

## 2020-06-11 DIAGNOSIS — E1169 Type 2 diabetes mellitus with other specified complication: Secondary | ICD-10-CM | POA: Diagnosis not present

## 2020-06-11 DIAGNOSIS — N400 Enlarged prostate without lower urinary tract symptoms: Secondary | ICD-10-CM | POA: Diagnosis not present

## 2020-06-11 DIAGNOSIS — I10 Essential (primary) hypertension: Secondary | ICD-10-CM | POA: Diagnosis not present

## 2020-06-11 DIAGNOSIS — Z23 Encounter for immunization: Secondary | ICD-10-CM | POA: Diagnosis not present

## 2020-06-11 DIAGNOSIS — Z0000 Encounter for general adult medical examination without abnormal findings: Secondary | ICD-10-CM | POA: Diagnosis not present

## 2020-06-11 DIAGNOSIS — Z1211 Encounter for screening for malignant neoplasm of colon: Secondary | ICD-10-CM | POA: Diagnosis not present

## 2020-06-11 DIAGNOSIS — E782 Mixed hyperlipidemia: Secondary | ICD-10-CM | POA: Diagnosis not present

## 2020-06-11 DIAGNOSIS — Z1331 Encounter for screening for depression: Secondary | ICD-10-CM | POA: Diagnosis not present

## 2020-06-11 DIAGNOSIS — Z7289 Other problems related to lifestyle: Secondary | ICD-10-CM | POA: Diagnosis not present

## 2020-06-11 DIAGNOSIS — R972 Elevated prostate specific antigen [PSA]: Secondary | ICD-10-CM | POA: Diagnosis not present

## 2020-07-23 ENCOUNTER — Other Ambulatory Visit: Payer: Self-pay

## 2020-07-23 ENCOUNTER — Encounter: Payer: Self-pay | Admitting: Neurology

## 2020-07-23 ENCOUNTER — Ambulatory Visit: Payer: PPO | Admitting: Neurology

## 2020-07-23 VITALS — BP 143/80 | HR 79 | Ht 69.0 in | Wt 180.0 lb

## 2020-07-23 DIAGNOSIS — R569 Unspecified convulsions: Secondary | ICD-10-CM

## 2020-07-23 MED ORDER — DIVALPROEX SODIUM 500 MG PO DR TAB: 500.0000 mg | DELAYED_RELEASE_TABLET | Freq: Every morning | ORAL | 1 refills | Status: DC

## 2020-07-23 NOTE — Progress Notes (Signed)
[facility] [address] [place]. [place] [address] [phone]       OFFICE FOLLOW-UP NOTE  [name] Date of Birth:  02-22-1951 Medical Record Number:  [id number]   HPI: Samuel Lee is a 69 year old pleasant Caucasian male seen today for initial office follow-up visit for hospital admission for strokelike episode in October 2021.  History is obtained from the patient, review of electronic medical records and personally reviewed pertinent imaging films in PACS. Patient has no significant past medical history except melanoma and arthritis.  He was admitted on 05/05/2020 and his wife who had last seen him him to be normal on 4 and she left the house returned an hour or so later and noticed that he was unable to get out of his chair due to left facial droop slurred speech and left-sided weakness.  EMS was called brought him as a code stroke.  NIH stroke scale on admission was 6.  Noncontrast CT scan of the head was unremarkable and CT angiogram of the brain and neck showed no large vessel occlusion.  Patient was given IV TPA and admitted to the neurological intensive care unit.  Subsequently MRI scan of the brain was obtained which showed no acute stroke.  EEG showed generalized slowing.  Patient was placed on long-term monitoring and no definite seizures were noted.  LDL cholesterol was 102 mg percent and hemoglobin A1c was 4.6.  Vitamin B12, TSH, RPR, HIV, folate and ESR were all normal.  Patient was noted as having some dried blood on the left corner of his mouth and he was found to have slight memory difficulties for the episode and it was thought he could have had an unwitnessed seizure with postictal state presenting like a strokelike episode.  He was started on Depakote ER 500 twice daily.  Patient states he has been tolerating the medication well but he just ran out yesterday.  Is not keen to continue medications as he does not like taking them and would like me to  reduce and stop it.  He has had no recurrent episodes of confusion or weakness and is completely back to baseline.  He has no new complaints today.  He has no prior history of seizures, significant head injury with loss of consciousness.  No family history ofepilepsy or strokes. ROS:   14 system review of systems is positive for weakness, facial droop, confusion and all other systems negative  PMH:  Past Medical History:  Diagnosis Date  . Arthritis   . Cancer ([place])    melanoma    Social History:  Social History   Socioeconomic History  . Marital status: Married    Spouse name: [name]  . Number of children: Not on file  . Years of education: Not on file  . Highest education level: Not on file  Occupational History    Comment: retired 2020  Tobacco Use  . Smoking status: Never Smoker  . Smokeless tobacco: Never Used  Substance and Sexual Activity  . Alcohol use: Yes  . Drug use: No  . Sexual activity: Not on file  Other Topics Concern  . Not on file  Social History Narrative   Lives with wife, [name]   Right Handed   Drinks 2-3 cups caffeine/daily   Social Determinants of Health   Financial Resource Strain: Not on file  Food Insecurity: Not on file  Transportation Needs: Not on file  Physical Activity: Not on file  Stress: Not on file  Social  Connections: Not on file  Intimate Partner Violence: Not on file    Medications:   Current Outpatient Medications on File Prior to Visit  Medication Sig Dispense Refill  . amLODipine (NORVASC) 5 MG tablet Take 1 tablet (5 mg total) by mouth daily. 30 tablet 1  . atorvastatin (LIPITOR) 20 MG tablet Take 1 tablet (20 mg total) by mouth daily. 30 tablet 1  . diphenhydramine-acetaminophen (TYLENOL PM) 25-500 MG TABS tablet Take 2 tablets by mouth at bedtime as needed.    . folic acid (FOLVITE) 1 MG tablet Take 1 tablet (1 mg total) by mouth daily. 30 tablet 1  . Multiple Vitamin (MULTIVITAMIN WITH MINERALS) TABS tablet Take 1  tablet by mouth daily.    Marland Kitchen terazosin (HYTRIN) 1 MG capsule Take 1 mg by mouth at bedtime.    . thiamine (VITAMIN B-1) 100 MG tablet Take 100 mg by mouth daily.    Marland Kitchen thiamine 100 MG tablet Take 1 tablet (100 mg total) by mouth daily. 30 tablet 1  . Ibuprofen-diphenhydrAMINE Cit (ADVIL PM) 200-38 MG TABS Take 2 tablets by mouth at bedtime.     No current facility-administered medications on file prior to visit.    Allergies:  No Known Allergies  Physical Exam General: well developed, well nourished elderly Caucasian male, seated, in no evident distress Head: head normocephalic and atraumatic.  Neck: supple with no carotid or supraclavicular bruits Cardiovascular: regular rate and rhythm, no murmurs Musculoskeletal: no deformity Skin:  no rash/petichiae Vascular:  Normal pulses all extremities Vitals:   07/23/20 0905  BP: (!) 143/80  Pulse: 79   Neurologic Exam Mental Status: Awake and fully alert. Oriented to place and time. Recent and remote memory intact. Attention span, concentration and fund of knowledge appropriate. Mood and affect appropriate.  Cranial Nerves: Fundoscopic exam reveals sharp disc margins. Pupils equal, briskly reactive to light. Extraocular movements full without nystagmus. Visual fields full to confrontation. Hearing intact. Facial sensation intact. Face, tongue, palate moves normally and symmetrically.  Motor: Normal bulk and tone. Normal strength in all tested extremity muscles. Sensory.: intact to touch ,pinprick .position and vibratory sensation.  Coordination: Rapid alternating movements normal in all extremities. Finger-to-nose and heel-to-shin performed accurately bilaterally. Gait and Station: Arises from chair without difficulty. Stance is normal. Gait demonstrates normal stride length and balance . Able to heel, toe and tandem walk with only slight difficulty.  Reflexes: 1+ and symmetric. Toes downgoing.   NIHSS  0 Modified Rankin  0   ASSESSMENT:  69 year old Caucasian male with episode of transient left-sided weakness and confusion possibly strokelike episode versus seizure with postictal state treated with IV TPA with excellent recovery but MRI negative for stroke and EEG showing diffuse generalized slowing.     PLAN: I had a long discussion with the patient regarding his recent strokelike episode and possible seizure and answered questions.  Patient is reluctant to continue Depakote DR and so recommend reducing the dose to 500 mg once daily for 3 months and check an EEG and if negative consider stopping it after 3 months.  I advised him to avoid seizure provoking stimuli like medication noncompliance, stimulants like alcohol and street drugs, irregular sleeping and eating habits and extremes of exertion.  He was advised not to drive for 6 months since his episode as per [facility].  He will return for follow-up in the future in 3 months or call earlier if necessary. Greater than 50% of time during this 25 minute visit was  spent on counseling,explanation of diagnosis, of strokelike episode versus seizure planning of further management, discussion with patient and family and coordination of care Antony Contras, MD Note: This document was prepared with digital dictation and possible smart phrase technology. Any transcriptional errors that result from this process are unintentional

## 2020-07-23 NOTE — Patient Instructions (Addendum)
I had a long discussion with the patient regarding his recent strokelike episode and possible seizure and answered questions.  Patient is reluctant to continue Depakote DR and so recommend reducing the dose to 500 mg once daily for 3 months and check an EEG and if negative consider stopping it after 3 months.  I advised him to avoid seizure provoking stimuli like medication noncompliance, stimulants like alcohol and street drugs, irregular sleeping and eating habits and extremes of exertion.  He was advised not to drive for 6 months since his episode as per [facility].  He will return for follow-up in the future in 3 months or call earlier if necessary.

## 2020-07-28 ENCOUNTER — Ambulatory Visit: Payer: PPO | Admitting: Neurology

## 2020-07-28 DIAGNOSIS — R569 Unspecified convulsions: Secondary | ICD-10-CM | POA: Diagnosis not present

## 2020-07-30 ENCOUNTER — Encounter: Payer: Self-pay | Admitting: *Deleted

## 2020-07-30 NOTE — Progress Notes (Unsigned)
[facility] - 07/30/20 1600      Assessment    Assessment type Phone to patient    Is patient still in hospital? No    Date of hospital discharge after thrombolysis? 05/11/20      Final 90-Day Modified Rankin Score   Final 90-Day Modified Rankin Score: ([facility]) 0-No symptoms from stroke remain, but able to carry out all usual activities      [id number]   Mobility 1- no problems in walking about    Self-care 1- no problems with Self-care    Usual activities 1- no problems with performing usual activities (e.g. work, study, housework, family or leisure activities)    Pain/discomfort 1- no pain or discomfort    Anxiety/Depression 1- not anxious or depressed    What number between 0-100 best describes the patient's health state today (100 means the best health; 0 means the worst health)? 34      Hospital Admission   In the Past 3 months (since your initial hospitalisation for stroke), have you been admitted to hospital (including day-only procedures) for any reason? No      Doctor consultations   In the past 3 months (since your initial hospitalisation for stroke), have you seen any doctors or other health professional (for example physiotherapy, outpatient nurse, general practitioner) for any reason? Yes      a. Doctor consultations   a. Type of service Neurology    a. Condition or purpose Seizures    a. Date of appointment 07/23/20

## 2020-07-31 NOTE — Patient Outreach (Signed)
[facility]) Care Management  07/31/2020  Samuel Lee 11/03/1950 [id number]   Telephone outreach to patient made by Arlana Hove to obtain mRS was successfully completed. MRS= 0. Called patient on 07/30/20.  Thank you, Vanice Sarah [facility] Care Management Assistant

## 2020-08-04 DIAGNOSIS — R350 Frequency of micturition: Secondary | ICD-10-CM | POA: Diagnosis not present

## 2020-08-04 DIAGNOSIS — N5201 Erectile dysfunction due to arterial insufficiency: Secondary | ICD-10-CM | POA: Diagnosis not present

## 2020-08-04 DIAGNOSIS — N401 Enlarged prostate with lower urinary tract symptoms: Secondary | ICD-10-CM | POA: Diagnosis not present

## 2020-08-04 NOTE — Progress Notes (Signed)
Kindly inform the patient that EEG study was normal

## 2020-08-05 ENCOUNTER — Telehealth: Payer: Self-pay | Admitting: Emergency Medicine

## 2020-08-05 NOTE — Telephone Encounter (Signed)
-----   Message from Garvin Fila, MD sent at 08/04/2020  5:35 PM EST ----- Kindly inform the patient that EEG study was normal

## 2020-08-05 NOTE — Telephone Encounter (Signed)
Yes I recommended the patient stay on Depakote ER 500 mg once daily till his next follow-up appointment with me in 3 months

## 2020-08-05 NOTE — Telephone Encounter (Signed)
Patient called back regarding EEG results and is asking if he needs to continue on the depakote until seen by Dr, Leonie Man in march?  I have forwarded Dr. Leonie Man this message.

## 2020-08-05 NOTE — Telephone Encounter (Signed)
LVM to return call.

## 2020-08-05 NOTE — Telephone Encounter (Signed)
-----   Message from Pramod S Sethi, MD sent at 08/04/2020  5:35 PM EST ----- Kindly inform the patient that EEG study was normal 

## 2020-08-06 NOTE — Telephone Encounter (Signed)
Called and LVM that gave Dr. Clydene Fake recommendation about staying on Depakote. Left office number to call back if he had any additional questions.

## 2020-09-30 ENCOUNTER — Ambulatory Visit: Payer: PPO | Admitting: Neurology

## 2020-09-30 ENCOUNTER — Encounter: Payer: Self-pay | Admitting: Neurology

## 2020-09-30 VITALS — BP 146/72 | HR 85 | Ht 69.0 in | Wt 192.2 lb

## 2020-09-30 DIAGNOSIS — R2990 Unspecified symptoms and signs involving the nervous system: Secondary | ICD-10-CM | POA: Diagnosis not present

## 2020-09-30 MED ORDER — DIVALPROEX SODIUM 500 MG PO DR TAB: 500.0000 mg | DELAYED_RELEASE_TABLET | Freq: Every morning | ORAL | 1 refills | Status: AC

## 2020-09-30 NOTE — Patient Instructions (Signed)
I had a long discussion with the patient regarding his episode of transient left-sided weakness being of undetermined etiology possibly a TIA versus seizure with postictal weakness.  He has had negative overnight EEG monitoring as well as recent follow-up EEG study and it may be safe to wean him off Depakote and discontinue it.  I recommend he reduce the Depakote DR 500 mg tablet to once every other day for 4 weeks and stop.  He can also start driving since it has been more than 6 months since this episode.  He may return for follow-up in the future only as necessary and no schedule appointment was made.

## 2020-09-30 NOTE — Progress Notes (Signed)
[facility] [address] [place]. [place] [address] [phone]       OFFICE FOLLOW-UP NOTE  [name] Date of Birth:  [birth year] Medical Record Number:  [id number]   HPI: Initial visit 07/23/2020 Samuel Lee is a 70 year old pleasant Caucasian male seen today for initial office follow-up visit for hospital admission for strokelike episode in October 2021.  History is obtained from the patient, review of electronic medical records and personally reviewed pertinent imaging films in PACS. Patient has no significant past medical history except melanoma and arthritis.  He was admitted on 05/05/2020 and his wife who had last seen him him to be normal on 69 and she left the house returned an hour or so later and noticed that he was unable to get out of his chair due to left facial droop slurred speech and left-sided weakness.  EMS was called brought him as a code stroke.  NIH stroke scale on admission was 6.  Noncontrast CT scan of the head was unremarkable and CT angiogram of the brain and neck showed no large vessel occlusion.  Patient was given IV TPA and admitted to the neurological intensive care unit.  Subsequently MRI scan of the brain was obtained which showed no acute stroke.  EEG showed generalized slowing.  Patient was placed on long-term monitoring and no definite seizures were noted.  LDL cholesterol was 102 mg percent and hemoglobin A1c was 4.6.  Vitamin B12, TSH, RPR, HIV, folate and ESR were all normal.  Patient was noted as having some dried blood on the left corner of his mouth and he was found to have slight memory difficulties for the episode and it was thought he could have had an unwitnessed seizure with postictal state presenting like a strokelike episode.  He was started on Depakote ER 500 twice daily.  Patient states he has been tolerating the medication well but he just ran out yesterday.  Is not keen to continue medications as he does not like taking  them and would like me to reduce and stop it.  He has had no recurrent episodes of confusion or weakness and is completely back to baseline.  He has no new complaints today.  He has no prior history of seizures, significant head injury with loss of consciousness.  No family history ofepilepsy or strokes. Update 09/30/2020: He returns for follow-up after last visit 2 months ago.  He states is doing well.  He has had no further episodes of slurred speech or weakness.  He has had no other seizure-like episodes 2.  Is tolerating Depakote DR finder milligram once daily without any side effects.  He did have outpatient EEG done on 08/04/2020 which was normal.  He has no complaints.  He wants to start driving. ROS:   14 system review of systems is positive for no complaints today and all other systems negative  PMH:  Past Medical History:  Diagnosis Date  . Arthritis   . Cancer ([place])    melanoma    Social History:  Social History   Socioeconomic History  . Marital status: Married    Spouse name: [name]  . Number of children: Not on file  . Years of education: Not on file  . Highest education level: Not on file  Occupational History    Comment: retired 2020  Tobacco Use  . Smoking status: Never Smoker  . Smokeless tobacco: Never Used  Substance and Sexual Activity  . Alcohol use: Yes  . Drug  use: No  . Sexual activity: Not on file  Other Topics Concern  . Not on file  Social History Narrative   Lives with wife, [name]   Right Handed   Drinks 2-3 cups caffeine/daily   Social Determinants of Health   Financial Resource Strain: Not on file  Food Insecurity: Not on file  Transportation Needs: Not on file  Physical Activity: Not on file  Stress: Not on file  Social Connections: Not on file  Intimate Partner Violence: Not on file    Medications:   Current Outpatient Medications on File Prior to Visit  Medication Sig Dispense Refill  . amLODipine (NORVASC) 5 MG tablet Take 1 tablet  (5 mg total) by mouth daily. 30 tablet 1  . atorvastatin (LIPITOR) 20 MG tablet Take 1 tablet (20 mg total) by mouth daily. 30 tablet 1  . diphenhydramine-acetaminophen (TYLENOL PM) 25-500 MG TABS tablet Take 2 tablets by mouth at bedtime as needed.    . folic acid (FOLVITE) 1 MG tablet Take 1 tablet (1 mg total) by mouth daily. 30 tablet 1  . Multiple Vitamin (MULTIVITAMIN WITH MINERALS) TABS tablet Take 1 tablet by mouth daily.    Marland Kitchen terazosin (HYTRIN) 1 MG capsule Take 1 mg by mouth at bedtime.    . thiamine (VITAMIN B-1) 100 MG tablet Take 100 mg by mouth daily.    Marland Kitchen thiamine 100 MG tablet Take 1 tablet (100 mg total) by mouth daily. 30 tablet 1   No current facility-administered medications on file prior to visit.    Allergies:  No Known Allergies  Physical Exam General: well developed, well nourished elderly Caucasian male, seated, in no evident distress Head: head normocephalic and atraumatic.  Neck: supple with no carotid or supraclavicular bruits Cardiovascular: regular rate and rhythm, no murmurs Musculoskeletal: no deformity Skin:  no Lee/petichiae Vascular:  Normal pulses all extremities Vitals:   09/30/20 1350  BP: (!) 146/72  Pulse: 85   Neurologic Exam Mental Status: Awake and fully alert. Oriented to place and time. Recent and remote memory intact. Attention span, concentration and fund of knowledge appropriate. Mood and affect appropriate.  Cranial Nerves: Fundoscopic exam not done. Pupils equal, briskly reactive to light. Extraocular movements full without nystagmus. Visual fields full to confrontation. Hearing intact. Facial sensation intact. Face, tongue, palate moves normally and symmetrically.  Motor: Normal bulk and tone. Normal strength in all tested extremity muscles. Sensory.: intact to touch ,pinprick .position and vibratory sensation.  Coordination: Rapid alternating movements normal in all extremities. Finger-to-nose and heel-to-shin performed accurately  bilaterally. Gait and Station: Arises from chair without difficulty. Stance is normal. Gait demonstrates normal stride length and balance . Able to heel, toe and tandem walk with only slight difficulty.  Reflexes: 1+ and symmetric. Toes downgoing.       ASSESSMENT: 70 year old Caucasian male with episode of transient left-sided weakness and confusion possibly strokelike episode versus seizure with postictal state treated with IV TPA with excellent recovery but MRI negative for stroke and EEG showing diffuse generalized slowing.     PLAN: I had a long discussion with the patient regarding his episode of transient left-sided weakness being of undetermined etiology possibly a TIA versus seizure with postictal weakness.  He has had negative overnight EEG monitoring as well as recent follow-up EEG study and it may be safe to wean him off Depakote and discontinue it.  I recommend he reduce the Depakote DR 500 mg tablet to once every other day for 4 weeks and  stop.  He can also start driving since it has been more than 6 months since this episode.  He may return for follow-up in the future only as necessary and no schedule appointment was made. Greater than 50% of time during this 25 minute visit was spent on counseling,explanation of diagnosis of strokelike episode versus seizure, of strokelike episode versus seizure planning of further management, discussion with patient and family and coordination of care Antony Contras, MD Note: This document was prepared with digital dictation and possible smart phrase technology. Any transcriptional errors that result from this process are unintentional

## 2020-10-14 ENCOUNTER — Other Ambulatory Visit: Payer: Self-pay | Admitting: Neurology

## 2020-11-02 ENCOUNTER — Other Ambulatory Visit: Payer: Self-pay | Admitting: Neurology

## 2021-06-02 DIAGNOSIS — Z23 Encounter for immunization: Secondary | ICD-10-CM | POA: Diagnosis not present

## 2021-06-02 DIAGNOSIS — Z85820 Personal history of malignant melanoma of skin: Secondary | ICD-10-CM | POA: Diagnosis not present

## 2021-06-02 DIAGNOSIS — L281 Prurigo nodularis: Secondary | ICD-10-CM | POA: Diagnosis not present

## 2021-06-02 DIAGNOSIS — L814 Other melanin hyperpigmentation: Secondary | ICD-10-CM | POA: Diagnosis not present

## 2021-06-02 DIAGNOSIS — Z86018 Personal history of other benign neoplasm: Secondary | ICD-10-CM | POA: Diagnosis not present

## 2021-06-02 DIAGNOSIS — Z85828 Personal history of other malignant neoplasm of skin: Secondary | ICD-10-CM | POA: Diagnosis not present

## 2021-06-02 DIAGNOSIS — D225 Melanocytic nevi of trunk: Secondary | ICD-10-CM | POA: Diagnosis not present

## 2021-06-02 DIAGNOSIS — L578 Other skin changes due to chronic exposure to nonionizing radiation: Secondary | ICD-10-CM | POA: Diagnosis not present

## 2021-06-02 DIAGNOSIS — L821 Other seborrheic keratosis: Secondary | ICD-10-CM | POA: Diagnosis not present

## 2021-06-11 DIAGNOSIS — Z125 Encounter for screening for malignant neoplasm of prostate: Secondary | ICD-10-CM | POA: Diagnosis not present

## 2021-06-11 DIAGNOSIS — Z0000 Encounter for general adult medical examination without abnormal findings: Secondary | ICD-10-CM | POA: Diagnosis not present

## 2021-06-16 DIAGNOSIS — R972 Elevated prostate specific antigen [PSA]: Secondary | ICD-10-CM | POA: Diagnosis not present

## 2021-06-16 DIAGNOSIS — E785 Hyperlipidemia, unspecified: Secondary | ICD-10-CM | POA: Diagnosis not present

## 2021-06-16 DIAGNOSIS — Z1331 Encounter for screening for depression: Secondary | ICD-10-CM | POA: Diagnosis not present

## 2021-06-16 DIAGNOSIS — H6122 Impacted cerumen, left ear: Secondary | ICD-10-CM | POA: Diagnosis not present

## 2021-06-16 DIAGNOSIS — I10 Essential (primary) hypertension: Secondary | ICD-10-CM | POA: Diagnosis not present

## 2021-06-16 DIAGNOSIS — H6121 Impacted cerumen, right ear: Secondary | ICD-10-CM | POA: Diagnosis not present

## 2021-06-16 DIAGNOSIS — Z0000 Encounter for general adult medical examination without abnormal findings: Secondary | ICD-10-CM | POA: Diagnosis not present

## 2021-06-16 DIAGNOSIS — Z1339 Encounter for screening examination for other mental health and behavioral disorders: Secondary | ICD-10-CM | POA: Diagnosis not present

## 2021-06-16 DIAGNOSIS — Z1211 Encounter for screening for malignant neoplasm of colon: Secondary | ICD-10-CM | POA: Diagnosis not present

## 2021-06-16 DIAGNOSIS — H6123 Impacted cerumen, bilateral: Secondary | ICD-10-CM | POA: Diagnosis not present

## 2021-06-16 DIAGNOSIS — L821 Other seborrheic keratosis: Secondary | ICD-10-CM | POA: Diagnosis not present

## 2021-11-10 DIAGNOSIS — R350 Frequency of micturition: Secondary | ICD-10-CM | POA: Diagnosis not present

## 2021-11-17 DIAGNOSIS — N401 Enlarged prostate with lower urinary tract symptoms: Secondary | ICD-10-CM | POA: Diagnosis not present

## 2021-11-17 DIAGNOSIS — R350 Frequency of micturition: Secondary | ICD-10-CM | POA: Diagnosis not present

## 2021-11-17 DIAGNOSIS — R972 Elevated prostate specific antigen [PSA]: Secondary | ICD-10-CM | POA: Diagnosis not present

## 2022-02-23 DIAGNOSIS — R972 Elevated prostate specific antigen [PSA]: Secondary | ICD-10-CM | POA: Diagnosis not present

## 2022-03-01 DIAGNOSIS — R972 Elevated prostate specific antigen [PSA]: Secondary | ICD-10-CM | POA: Diagnosis not present

## 2022-04-16 DIAGNOSIS — R972 Elevated prostate specific antigen [PSA]: Secondary | ICD-10-CM | POA: Diagnosis not present

## 2022-04-21 ENCOUNTER — Encounter: Payer: Self-pay | Admitting: Internal Medicine

## 2022-05-18 ENCOUNTER — Ambulatory Visit (AMBULATORY_SURGERY_CENTER): Payer: Self-pay

## 2022-05-18 VITALS — Ht 69.0 in | Wt 166.2 lb

## 2022-05-18 DIAGNOSIS — Z1211 Encounter for screening for malignant neoplasm of colon: Secondary | ICD-10-CM

## 2022-05-18 MED ORDER — NA SULFATE-K SULFATE-MG SULF 17.5-3.13-1.6 GM/177ML PO SOLN: 1.0000 | Freq: Once | ORAL | 0 refills | Status: AC

## 2022-05-18 NOTE — Progress Notes (Signed)

## 2022-06-07 DIAGNOSIS — L814 Other melanin hyperpigmentation: Secondary | ICD-10-CM | POA: Diagnosis not present

## 2022-06-07 DIAGNOSIS — D225 Melanocytic nevi of trunk: Secondary | ICD-10-CM | POA: Diagnosis not present

## 2022-06-07 DIAGNOSIS — Z85820 Personal history of malignant melanoma of skin: Secondary | ICD-10-CM | POA: Diagnosis not present

## 2022-06-07 DIAGNOSIS — Z85828 Personal history of other malignant neoplasm of skin: Secondary | ICD-10-CM | POA: Diagnosis not present

## 2022-06-07 DIAGNOSIS — L578 Other skin changes due to chronic exposure to nonionizing radiation: Secondary | ICD-10-CM | POA: Diagnosis not present

## 2022-06-07 DIAGNOSIS — Z86018 Personal history of other benign neoplasm: Secondary | ICD-10-CM | POA: Diagnosis not present

## 2022-06-07 DIAGNOSIS — L821 Other seborrheic keratosis: Secondary | ICD-10-CM | POA: Diagnosis not present

## 2022-06-10 ENCOUNTER — Encounter: Payer: Self-pay | Admitting: Internal Medicine

## 2022-06-16 DIAGNOSIS — Z114 Encounter for screening for human immunodeficiency virus [HIV]: Secondary | ICD-10-CM | POA: Diagnosis not present

## 2022-06-16 DIAGNOSIS — Z0000 Encounter for general adult medical examination without abnormal findings: Secondary | ICD-10-CM | POA: Diagnosis not present

## 2022-06-16 DIAGNOSIS — Z125 Encounter for screening for malignant neoplasm of prostate: Secondary | ICD-10-CM | POA: Diagnosis not present

## 2022-06-21 ENCOUNTER — Encounter: Payer: Self-pay | Admitting: Internal Medicine

## 2022-06-21 ENCOUNTER — Ambulatory Visit (AMBULATORY_SURGERY_CENTER): Payer: PPO | Admitting: Internal Medicine

## 2022-06-21 VITALS — BP 113/53 | HR 55 | Temp 96.6°F | Resp 13 | Ht 69.0 in | Wt 166.0 lb

## 2022-06-21 DIAGNOSIS — Z1211 Encounter for screening for malignant neoplasm of colon: Secondary | ICD-10-CM | POA: Diagnosis not present

## 2022-06-21 DIAGNOSIS — D128 Benign neoplasm of rectum: Secondary | ICD-10-CM

## 2022-06-21 DIAGNOSIS — K635 Polyp of colon: Secondary | ICD-10-CM | POA: Diagnosis not present

## 2022-06-21 DIAGNOSIS — D125 Benign neoplasm of sigmoid colon: Secondary | ICD-10-CM | POA: Diagnosis not present

## 2022-06-21 DIAGNOSIS — D122 Benign neoplasm of ascending colon: Secondary | ICD-10-CM

## 2022-06-21 DIAGNOSIS — K621 Rectal polyp: Secondary | ICD-10-CM | POA: Diagnosis not present

## 2022-06-21 MED ORDER — SODIUM CHLORIDE 0.9 % IV SOLN: 500.0000 mL | Freq: Once | INTRAVENOUS | Status: DC

## 2022-06-21 NOTE — Progress Notes (Signed)
Called to room to assist during endoscopic procedure.  Patient ID and intended procedure confirmed with present staff. Received instructions for my participation in the procedure from the performing physician.  

## 2022-06-21 NOTE — Progress Notes (Signed)
Pt's states no medical or surgical changes since previsit or office visit. VS assessed by D.T 

## 2022-06-21 NOTE — Progress Notes (Signed)
HISTORY OF PRESENT ILLNESS:  Samuel Lee is a 71 y.o. male screening colonoscopy.  Index examination with Dr. Sharlett Iles 2009 was normal.  No complaints  REVIEW OF SYSTEMS:  All non-GI ROS negative. Past Medical History:  Diagnosis Date   Arthritis    Cancer ([place])    melanoma    Past Surgical History:  Procedure Laterality Date   HIP SURGERY  03/2010   bilaterally hip   INGUINAL HERNIA REPAIR Right 04/09/2015   Procedure: RIGHT INGUINAL HERNIA REPAIR WITH MESH;  Surgeon: Erroll Luna, MD;  Location: [place];  Service: General;  Laterality: Right;   INSERTION OF MESH Right 04/09/2015   Procedure: INSERTION OF MESH;  Surgeon: Erroll Luna, MD;  Location: [facility];  Service: General;  Laterality: Right;   MELANOMA EXCISION  09/2010   nose    Social History Devaris Quirk  reports that he has never smoked. He has never used smokeless tobacco. He reports current alcohol use. He reports that he does not use drugs.  family history includes Hypertension in his father and mother.  No Known Allergies     PHYSICAL EXAMINATION: Vital signs: BP (!) 111/57   Pulse 60   Temp (!) 96.6 F (35.9 C) (Skin)   Ht '5\' 9"'$  (1.753 m)   Wt 166 lb (75.3 kg)   SpO2 98%   BMI 24.51 kg/m  General: Well-developed, well-nourished, no acute distress HEENT: Sclerae are anicteric, conjunctiva pink. Oral mucosa intact Lungs: Clear Heart: Regular Abdomen: soft, nontender, nondistended, no obvious ascites, no peritoneal signs, normal bowel sounds. No organomegaly. Extremities: No edema Psychiatric: alert and oriented x3. Cooperative     ASSESSMENT:  Colon cancer screening   PLAN:  Screening colonoscopy

## 2022-06-21 NOTE — Patient Instructions (Signed)
Handouts on diverticulosis and polyps given to you today Await pathology results   YOU HAD AN ENDOSCOPIC PROCEDURE TODAY AT [facility]:   Refer to the procedure report that was given to you for any specific questions about what was found during the examination.  If the procedure report does not answer your questions, please call your gastroenterologist to clarify.  If you requested that your care partner not be given the details of your procedure findings, then the procedure report has been included in a sealed envelope for you to review at your convenience later.  YOU SHOULD EXPECT: Some feelings of bloating in the abdomen. Passage of more gas than usual.  Walking can help get rid of the air that was put into your GI tract during the procedure and reduce the bloating. If you had a lower endoscopy (such as a colonoscopy or flexible sigmoidoscopy) you may notice spotting of blood in your stool or on the toilet paper. If you underwent a bowel prep for your procedure, you may not have a normal bowel movement for a few days.  Please Note:  You might notice some irritation and congestion in your nose or some drainage.  This is from the oxygen used during your procedure.  There is no need for concern and it should clear up in a day or so.  SYMPTOMS TO REPORT IMMEDIATELY:  Following lower endoscopy (colonoscopy or flexible sigmoidoscopy):  Excessive amounts of blood in the stool  Significant tenderness or worsening of abdominal pains  Swelling of the abdomen that is new, acute  Fever of 100F or higher  For urgent or emergent issues, a gastroenterologist can be reached at any hour by calling [phone]. Do not use MyChart messaging for urgent concerns.    DIET:  We do recommend a small meal at first, but then you may proceed to your regular diet.  Drink plenty of fluids but you should avoid alcoholic beverages for 24 hours.  ACTIVITY:  You should plan to take it easy for the  rest of today and you should NOT DRIVE or use heavy machinery until tomorrow (because of the sedation medicines used during the test).    FOLLOW UP: Our staff will call the number listed on your records the next business day following your procedure.  We will call around 7:15- 8:00 am to check on you and address any questions or concerns that you may have regarding the information given to you following your procedure. If we do not reach you, we will leave a message.     If any biopsies were taken you will be contacted by phone or by letter within the next 1-3 weeks.  Please call us at [phone] if you have not heard about the biopsies in 3 weeks.    SIGNATURES/CONFIDENTIALITY: You and/or your care partner have signed paperwork which will be entered into your electronic medical record.  These signatures attest to the fact that that the information above on your After Visit Summary has been reviewed and is understood.  Full responsibility of the confidentiality of this discharge information lies with you and/or your care-partner.

## 2022-06-21 NOTE — Op Note (Signed)
[facility] Patient Name: Samuel Lee Procedure Date: 06/21/2022 10:45 AM MRN: [id number] Endoscopist: Docia Chuck. Henrene Pastor , MD, [id number] Age: 71 Referring MD:  Date of Birth: 1951-02-27 Gender: Male Account #: [id number] Procedure:                Colonoscopy with cold snare polypectomy x 4 Indications:              Screening for colorectal malignant neoplasm.                            Negative index exam 2009 (Dr. Sharlett Iles) Medicines:                Monitored Anesthesia Care Procedure:                Pre-Anesthesia Assessment:                           - Prior to the procedure, a History and Physical                            was performed, and patient medications and                            allergies were reviewed. The patient's tolerance of                            previous anesthesia was also reviewed. The risks                            and benefits of the procedure and the sedation                            options and risks were discussed with the patient.                            All questions were answered, and informed consent                            was obtained. Prior Anticoagulants: The patient has                            taken no anticoagulant or antiplatelet agents. ASA                            Grade Assessment: II - A patient with mild systemic                            disease. After reviewing the risks and benefits,                            the patient was deemed in satisfactory condition to                            undergo the procedure.  After obtaining informed consent, the colonoscope                            was passed under direct vision. Throughout the                            procedure, the patient's blood pressure, pulse, and                            oxygen saturations were monitored continuously. The                            CF [id number] #[id number] was introduced through the anus                             and advanced to the the cecum, identified by                            appendiceal orifice and ileocecal valve. The                            ileocecal valve, appendiceal orifice, and rectum                            were photographed. The quality of the bowel                            preparation was excellent. The colonoscopy was                            performed without difficulty. The patient tolerated                            the procedure well. The bowel preparation used was                            SUPREP via split dose instruction. Scope In: 11:08:43 AM Scope Out: 11:28:44 AM Scope Withdrawal Time: 0 hours 16 minutes 1 second  Total Procedure Duration: 0 hours 20 minutes 1 second  Findings:                 Four polyps were found in the rectum, sigmoid colon                            and ascending colon. The polyps were 2 to 3 mm in                            size. These polyps were removed with a cold snare.                            Resection and retrieval were complete.                           Multiple diverticula were  found in the sigmoid                            colon.                           The exam was otherwise without abnormality on                            direct and retroflexion views. Complications:            No immediate complications. Estimated blood loss:                            None. Estimated Blood Loss:     Estimated blood loss: none. Impression:               - Four 2 to 3 mm polyps in the rectum, in the                            sigmoid colon and in the ascending colon, removed                            with a cold snare. Resected and retrieved.                           - Diverticulosis in the sigmoid colon.                           - The examination was otherwise normal on direct                            and retroflexion views. Recommendation:           - Repeat colonoscopy in 5 years if 3 or more                             adenomas, otherwise no future surveillance                            recommended.                           - Patient has a contact number available for                            emergencies. The signs and symptoms of potential                            delayed complications were discussed with the                            patient. Return to normal activities tomorrow.                            Written discharge instructions were provided to the  patient.                           - Resume previous diet.                           - Continue present medications.                           - Await pathology results. Docia Chuck. Henrene Pastor, MD 06/21/2022 11:38:47 AM This report has been signed electronically.

## 2022-06-21 NOTE — Progress Notes (Signed)
Sedate, gd SR, tolerated procedure well, VSS, report to RN 

## 2022-06-22 ENCOUNTER — Telehealth: Payer: Self-pay | Admitting: *Deleted

## 2022-06-22 DIAGNOSIS — N400 Enlarged prostate without lower urinary tract symptoms: Secondary | ICD-10-CM | POA: Diagnosis not present

## 2022-06-22 DIAGNOSIS — Z1331 Encounter for screening for depression: Secondary | ICD-10-CM | POA: Diagnosis not present

## 2022-06-22 DIAGNOSIS — H9190 Unspecified hearing loss, unspecified ear: Secondary | ICD-10-CM | POA: Diagnosis not present

## 2022-06-22 DIAGNOSIS — Z0000 Encounter for general adult medical examination without abnormal findings: Secondary | ICD-10-CM | POA: Diagnosis not present

## 2022-06-22 DIAGNOSIS — Z1339 Encounter for screening examination for other mental health and behavioral disorders: Secondary | ICD-10-CM | POA: Diagnosis not present

## 2022-06-22 DIAGNOSIS — I10 Essential (primary) hypertension: Secondary | ICD-10-CM | POA: Diagnosis not present

## 2022-06-22 DIAGNOSIS — R972 Elevated prostate specific antigen [PSA]: Secondary | ICD-10-CM | POA: Diagnosis not present

## 2022-06-22 DIAGNOSIS — H6123 Impacted cerumen, bilateral: Secondary | ICD-10-CM | POA: Diagnosis not present

## 2022-06-22 NOTE — Telephone Encounter (Signed)
Unable to reach patient. Has a phone for outgoing calls only.

## 2022-06-23 ENCOUNTER — Encounter: Payer: Self-pay | Admitting: Internal Medicine

## 2022-07-29 IMAGING — CT CT HEAD W/O CM
4 series · 16 of 47 positions shown, 18 images · non-contrast
Comparison: Brain MRI 05/06/2020.  CT head 05/05/2020.

CLINICAL DATA: 69-year-old male status post code stroke
presentation with left side weakness and slurred speech. No acute
infarct evident by MRI.

EXAM:
CT HEAD WITHOUT CONTRAST
TECHNIQUE: Contiguous axial images were obtained from the base of the skull
through the vertex without intravenous contrast.

[Series 3: head without · axial · non-contrast · 0.43mm/px · z∈[-150,-25]mm · 7 of 35 slices shown, 9 images]
[im 5/35  brain]
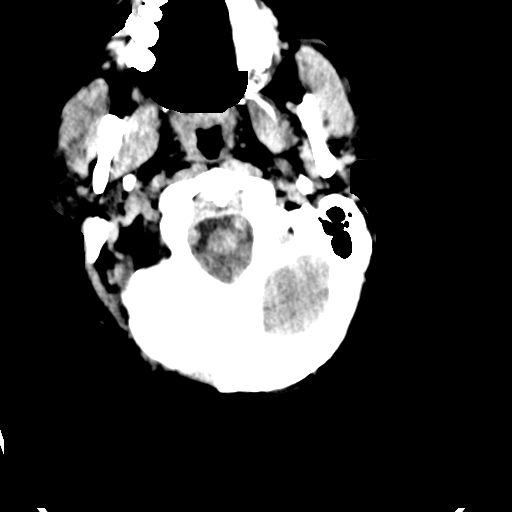
[im 5/35  bone]
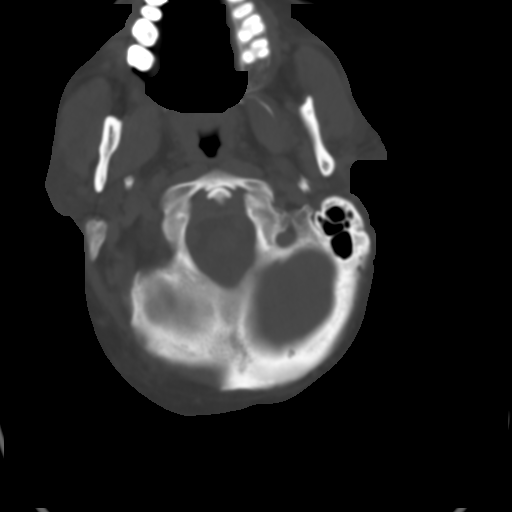
[im 9/35  brain]
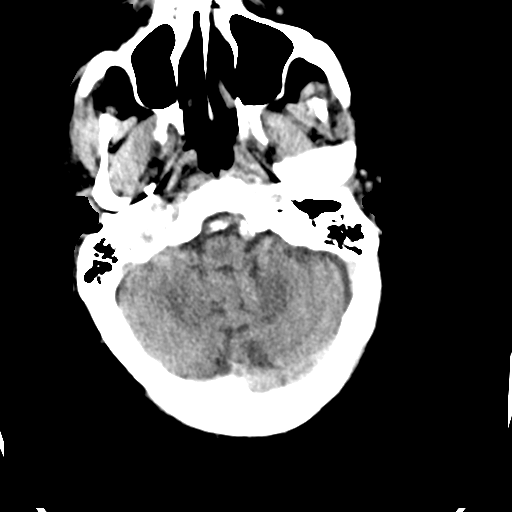
[im 13/35  brain]
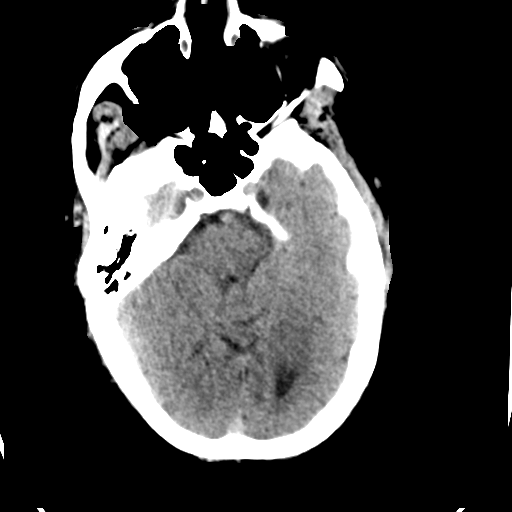
[im 18/35  brain]
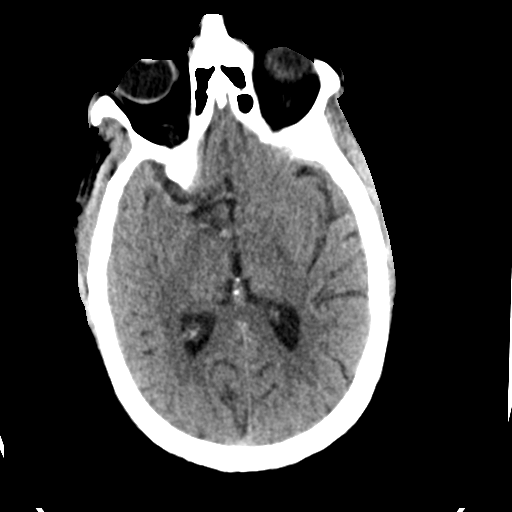
[im 22/35  brain]
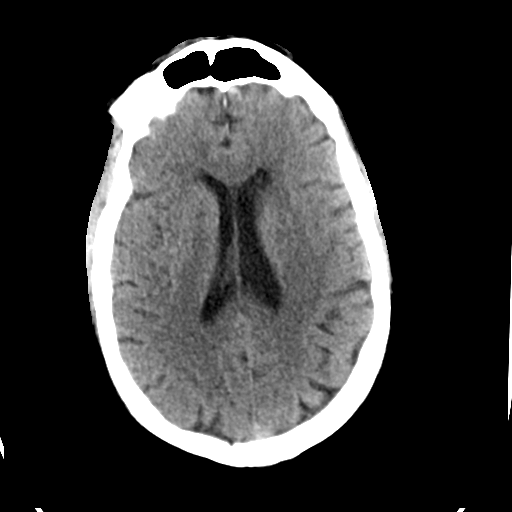
[im 22/35  bone]
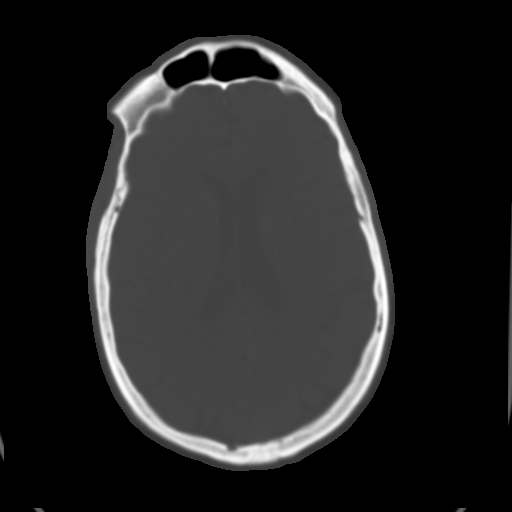
[im 26/35  brain]
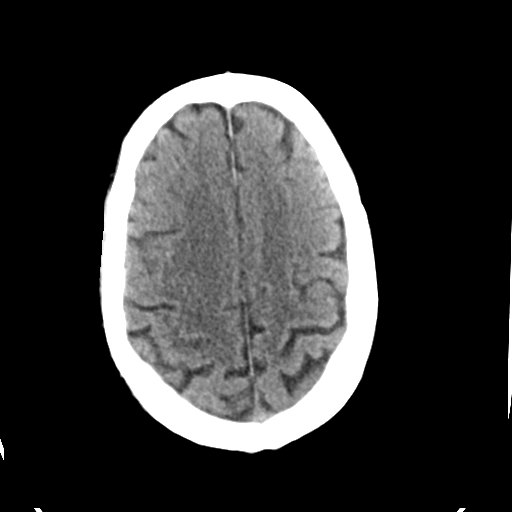
[im 30/35  brain]
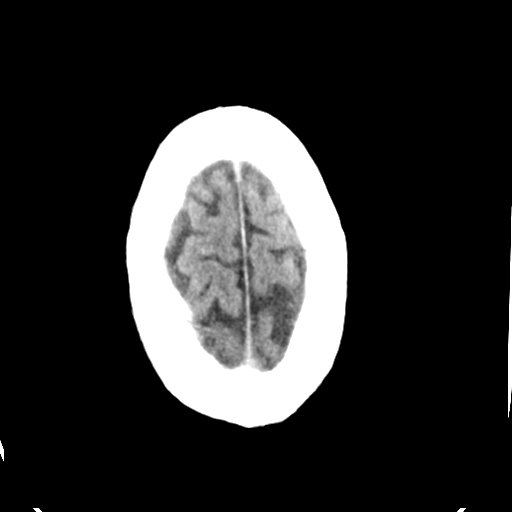

[Series 4: head bone · axial · 0.43mm/px · z∈[-154,-120]mm · 3 of 87 slices shown]
[im 9/87  bone]
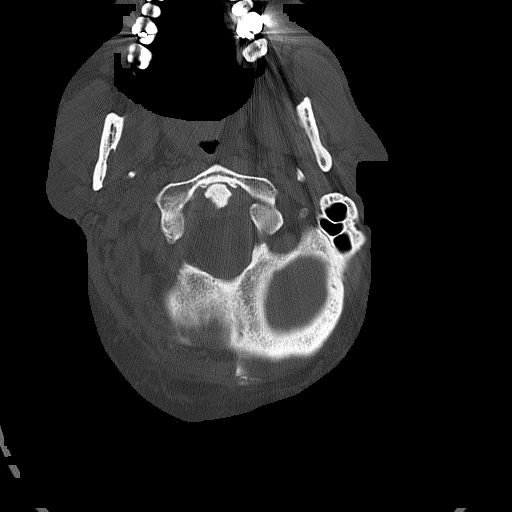
[im 18/87  bone]
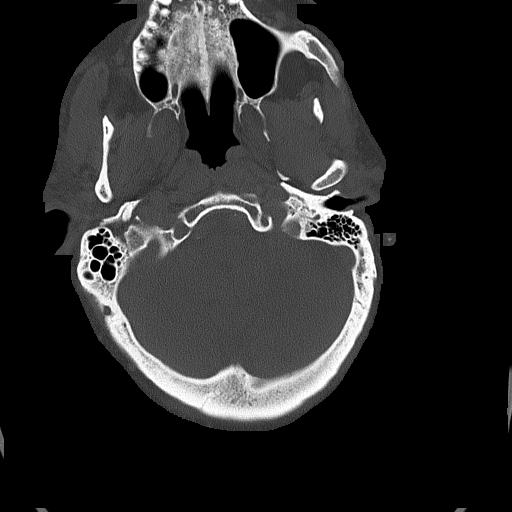
[im 26/87  bone]
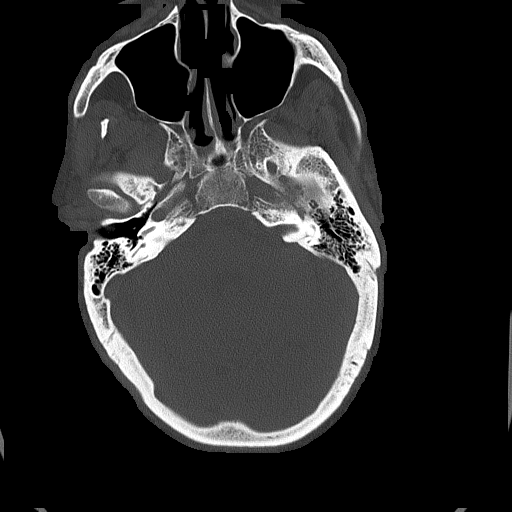

[Series 5: head without cor · coronal · non-contrast · 0.34mm/px · 3 of 67 slices shown]
[im 23/67  brain]
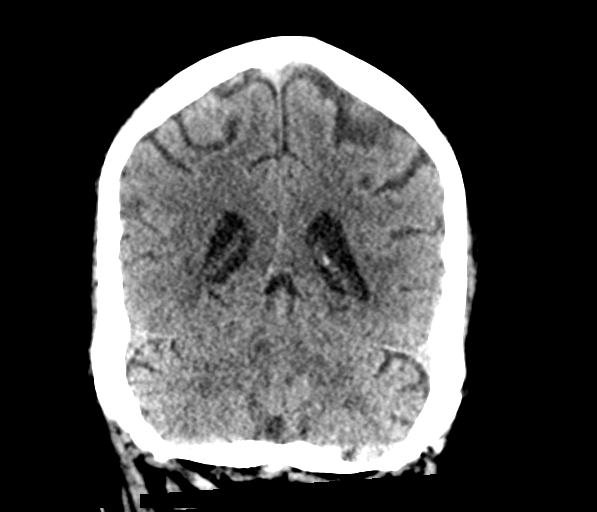
[im 30/67  brain]
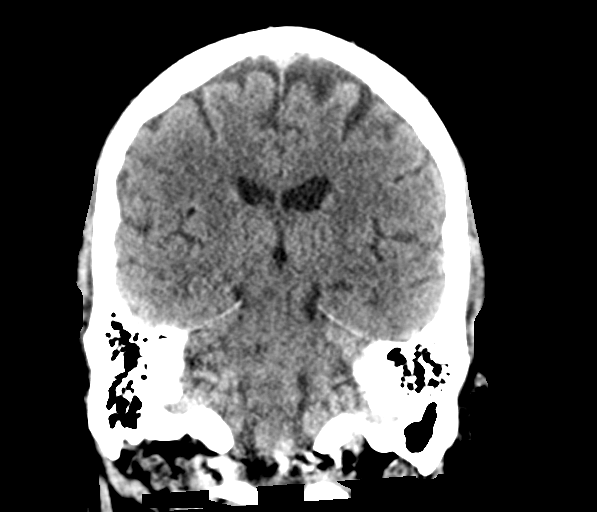
[im 37/67  brain]
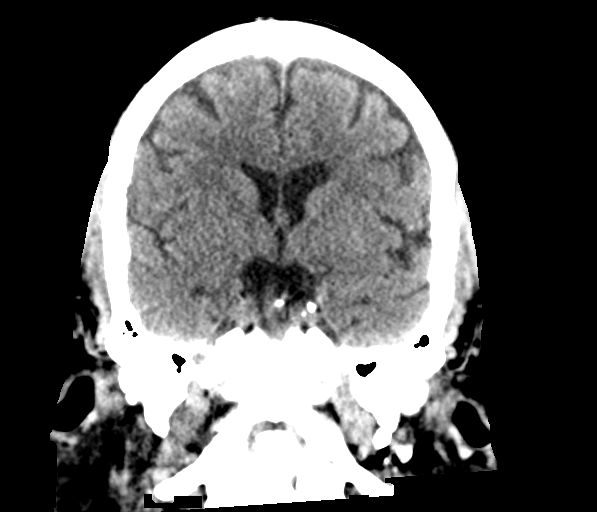

[Series 6: head without sag · sagittal · non-contrast · 0.34mm/px · 3 of 66 slices shown]
[im 22/66  brain]
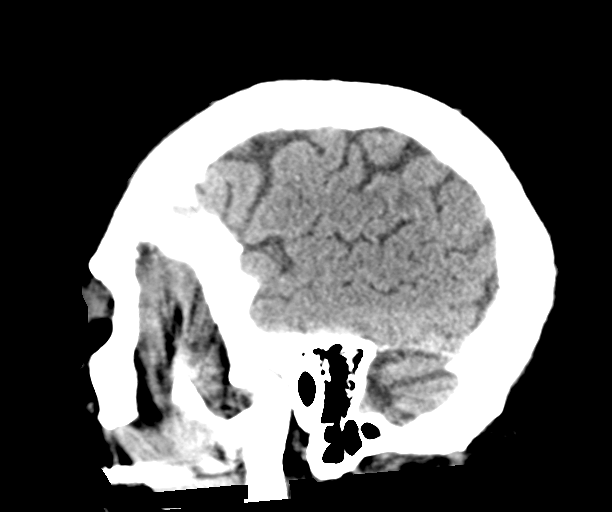
[im 33/66  brain]
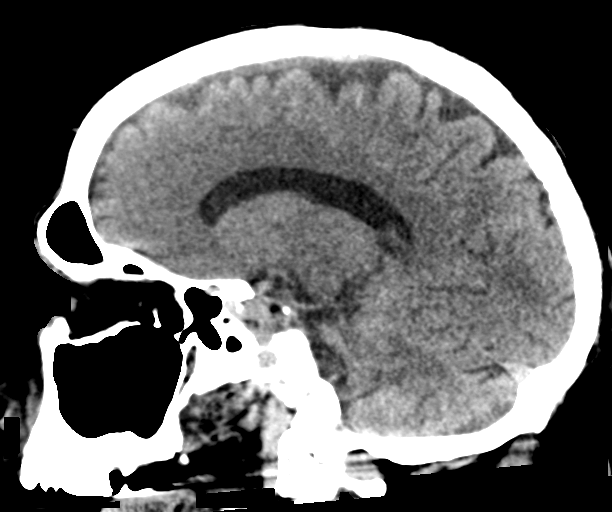
[im 44/66  brain]
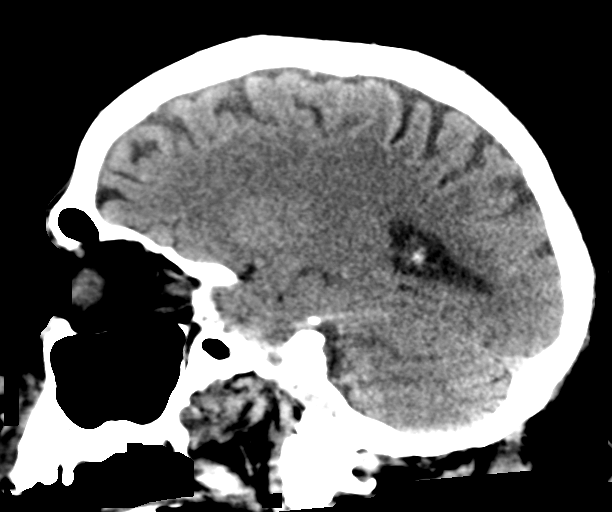

[16 of 47 positions shown; findings below may reference images not displayed]

FINDINGS: Brain: Normal cerebral volume for age. No midline shift,
ventriculomegaly, mass effect, evidence of mass lesion, intracranial
hemorrhage or evidence of cortically based acute infarction. Stable
gray-white matter differentiation throughout the brain. No
encephalomalacia identified.

Vascular: Calcified atherosclerosis at the skull base. No suspicious
intracranial vascular hyperdensity.

Skull: No acute osseous abnormality identified.

Sinuses/Orbits: Visualized paranasal sinuses and mastoids are clear.

Other: No acute orbit or scalp soft tissue finding.
IMPRESSION: Stable and negative non contrast CT appearance of the brain. No
acute or evolving infarct identified.

## 2022-10-25 DIAGNOSIS — R972 Elevated prostate specific antigen [PSA]: Secondary | ICD-10-CM | POA: Diagnosis not present

## 2022-11-01 DIAGNOSIS — N5201 Erectile dysfunction due to arterial insufficiency: Secondary | ICD-10-CM | POA: Diagnosis not present

## 2022-11-01 DIAGNOSIS — R972 Elevated prostate specific antigen [PSA]: Secondary | ICD-10-CM | POA: Diagnosis not present

## 2022-11-01 DIAGNOSIS — R350 Frequency of micturition: Secondary | ICD-10-CM | POA: Diagnosis not present

## 2022-11-01 DIAGNOSIS — N401 Enlarged prostate with lower urinary tract symptoms: Secondary | ICD-10-CM | POA: Diagnosis not present

## 2023-06-21 DIAGNOSIS — L814 Other melanin hyperpigmentation: Secondary | ICD-10-CM | POA: Diagnosis not present

## 2023-06-21 DIAGNOSIS — D225 Melanocytic nevi of trunk: Secondary | ICD-10-CM | POA: Diagnosis not present

## 2023-06-21 DIAGNOSIS — L821 Other seborrheic keratosis: Secondary | ICD-10-CM | POA: Diagnosis not present

## 2023-06-21 DIAGNOSIS — Z86018 Personal history of other benign neoplasm: Secondary | ICD-10-CM | POA: Diagnosis not present

## 2023-06-21 DIAGNOSIS — L578 Other skin changes due to chronic exposure to nonionizing radiation: Secondary | ICD-10-CM | POA: Diagnosis not present

## 2023-06-21 DIAGNOSIS — Z85820 Personal history of malignant melanoma of skin: Secondary | ICD-10-CM | POA: Diagnosis not present

## 2023-06-21 DIAGNOSIS — Z85828 Personal history of other malignant neoplasm of skin: Secondary | ICD-10-CM | POA: Diagnosis not present

## 2023-10-20 DIAGNOSIS — R972 Elevated prostate specific antigen [PSA]: Secondary | ICD-10-CM | POA: Diagnosis not present

## 2023-10-27 DIAGNOSIS — R972 Elevated prostate specific antigen [PSA]: Secondary | ICD-10-CM | POA: Diagnosis not present

## 2023-10-27 DIAGNOSIS — R350 Frequency of micturition: Secondary | ICD-10-CM | POA: Diagnosis not present

## 2023-10-27 DIAGNOSIS — N5201 Erectile dysfunction due to arterial insufficiency: Secondary | ICD-10-CM | POA: Diagnosis not present

## 2023-10-27 DIAGNOSIS — N401 Enlarged prostate with lower urinary tract symptoms: Secondary | ICD-10-CM | POA: Diagnosis not present

## 2023-11-04 ENCOUNTER — Other Ambulatory Visit (HOSPITAL_COMMUNITY): Payer: Self-pay | Admitting: Urology

## 2023-11-04 DIAGNOSIS — R972 Elevated prostate specific antigen [PSA]: Secondary | ICD-10-CM

## 2023-11-15 ENCOUNTER — Ambulatory Visit (HOSPITAL_COMMUNITY)
Admission: RE | Admit: 2023-11-15 | Discharge: 2023-11-15 | Disposition: A | Discharge status: Home / Self Care | Source: Ambulatory Visit | Attending: Urology | Admitting: Urology

## 2023-11-15 DIAGNOSIS — K5730 Diverticulosis of large intestine without perforation or abscess without bleeding: Secondary | ICD-10-CM | POA: Diagnosis not present

## 2023-11-15 DIAGNOSIS — N400 Enlarged prostate without lower urinary tract symptoms: Secondary | ICD-10-CM | POA: Diagnosis not present

## 2023-11-15 DIAGNOSIS — R972 Elevated prostate specific antigen [PSA]: Secondary | ICD-10-CM | POA: Insufficient documentation

## 2023-11-15 MED ORDER — GADOBUTROL 1 MMOL/ML IV SOLN
7.5000 mL | Freq: Once | INTRAVENOUS | Status: AC | PRN
  Administered 2023-11-15: 7.5 mL via INTRAVENOUS
